# Patient Record
Sex: Female | Born: 1960 | Race: White | Hispanic: No | Smoking: Never smoker
Health system: Southern US, Community
[De-identification: ages and names within clinical notes are randomized; demographics above are authoritative.]

## PROBLEM LIST (undated history)

## (undated) DIAGNOSIS — N189 Chronic kidney disease, unspecified: Secondary | ICD-10-CM

## (undated) DIAGNOSIS — I10 Essential (primary) hypertension: Secondary | ICD-10-CM

## (undated) DIAGNOSIS — E118 Type 2 diabetes mellitus with unspecified complications: Secondary | ICD-10-CM

## (undated) HISTORY — PX: CARPAL TUNNEL RELEASE: SHX101

---

## 2017-09-24 ENCOUNTER — Other Ambulatory Visit: Payer: Self-pay

## 2017-09-24 ENCOUNTER — Inpatient Hospital Stay (HOSPITAL_COMMUNITY)
Admission: EM | Admit: 2017-09-24 | Discharge: 2017-10-04 | DRG: 682 | Disposition: A | Discharge status: Home / Self Care | Attending: Internal Medicine | Admitting: Internal Medicine

## 2017-09-24 ENCOUNTER — Encounter (HOSPITAL_COMMUNITY): Payer: Self-pay | Admitting: *Deleted

## 2017-09-24 ENCOUNTER — Inpatient Hospital Stay (HOSPITAL_COMMUNITY)

## 2017-09-24 DIAGNOSIS — Z66 Do not resuscitate: Secondary | ICD-10-CM | POA: Diagnosis present

## 2017-09-24 DIAGNOSIS — D638 Anemia in other chronic diseases classified elsewhere: Secondary | ICD-10-CM | POA: Diagnosis present

## 2017-09-24 DIAGNOSIS — Z79899 Other long term (current) drug therapy: Secondary | ICD-10-CM

## 2017-09-24 DIAGNOSIS — K767 Hepatorenal syndrome: Secondary | ICD-10-CM | POA: Diagnosis present

## 2017-09-24 DIAGNOSIS — E039 Hypothyroidism, unspecified: Secondary | ICD-10-CM | POA: Diagnosis present

## 2017-09-24 DIAGNOSIS — E1122 Type 2 diabetes mellitus with diabetic chronic kidney disease: Secondary | ICD-10-CM | POA: Diagnosis present

## 2017-09-24 DIAGNOSIS — R945 Abnormal results of liver function studies: Secondary | ICD-10-CM

## 2017-09-24 DIAGNOSIS — H5462 Unqualified visual loss, left eye, normal vision right eye: Secondary | ICD-10-CM | POA: Diagnosis present

## 2017-09-24 DIAGNOSIS — N183 Chronic kidney disease, stage 3 (moderate): Secondary | ICD-10-CM | POA: Diagnosis present

## 2017-09-24 DIAGNOSIS — H35022 Exudative retinopathy, left eye: Secondary | ICD-10-CM | POA: Diagnosis present

## 2017-09-24 DIAGNOSIS — R197 Diarrhea, unspecified: Secondary | ICD-10-CM | POA: Diagnosis present

## 2017-09-24 DIAGNOSIS — K7460 Unspecified cirrhosis of liver: Secondary | ICD-10-CM | POA: Diagnosis present

## 2017-09-24 DIAGNOSIS — Z515 Encounter for palliative care: Secondary | ICD-10-CM

## 2017-09-24 DIAGNOSIS — R000 Tachycardia, unspecified: Secondary | ICD-10-CM | POA: Diagnosis present

## 2017-09-24 DIAGNOSIS — N19 Unspecified kidney failure: Secondary | ICD-10-CM

## 2017-09-24 DIAGNOSIS — K529 Noninfective gastroenteritis and colitis, unspecified: Secondary | ICD-10-CM | POA: Diagnosis present

## 2017-09-24 DIAGNOSIS — R278 Other lack of coordination: Secondary | ICD-10-CM | POA: Diagnosis present

## 2017-09-24 DIAGNOSIS — I129 Hypertensive chronic kidney disease with stage 1 through stage 4 chronic kidney disease, or unspecified chronic kidney disease: Secondary | ICD-10-CM | POA: Diagnosis present

## 2017-09-24 DIAGNOSIS — K7290 Hepatic failure, unspecified without coma: Secondary | ICD-10-CM

## 2017-09-24 DIAGNOSIS — N179 Acute kidney failure, unspecified: Secondary | ICD-10-CM

## 2017-09-24 DIAGNOSIS — I959 Hypotension, unspecified: Secondary | ICD-10-CM | POA: Diagnosis present

## 2017-09-24 DIAGNOSIS — K7200 Acute and subacute hepatic failure without coma: Secondary | ICD-10-CM | POA: Diagnosis present

## 2017-09-24 DIAGNOSIS — E669 Obesity, unspecified: Secondary | ICD-10-CM | POA: Diagnosis present

## 2017-09-24 DIAGNOSIS — Z8249 Family history of ischemic heart disease and other diseases of the circulatory system: Secondary | ICD-10-CM

## 2017-09-24 DIAGNOSIS — D649 Anemia, unspecified: Secondary | ICD-10-CM | POA: Diagnosis present

## 2017-09-24 DIAGNOSIS — D6489 Other specified anemias: Secondary | ICD-10-CM | POA: Diagnosis present

## 2017-09-24 DIAGNOSIS — R600 Localized edema: Secondary | ICD-10-CM | POA: Diagnosis present

## 2017-09-24 DIAGNOSIS — K8020 Calculus of gallbladder without cholecystitis without obstruction: Secondary | ICD-10-CM | POA: Diagnosis present

## 2017-09-24 DIAGNOSIS — R1110 Vomiting, unspecified: Secondary | ICD-10-CM | POA: Diagnosis present

## 2017-09-24 DIAGNOSIS — E8809 Other disorders of plasma-protein metabolism, not elsewhere classified: Secondary | ICD-10-CM | POA: Diagnosis present

## 2017-09-24 DIAGNOSIS — Z7189 Other specified counseling: Secondary | ICD-10-CM

## 2017-09-24 DIAGNOSIS — Z79890 Hormone replacement therapy (postmenopausal): Secondary | ICD-10-CM

## 2017-09-24 DIAGNOSIS — R188 Other ascites: Secondary | ICD-10-CM

## 2017-09-24 DIAGNOSIS — Z6836 Body mass index (BMI) 36.0-36.9, adult: Secondary | ICD-10-CM

## 2017-09-24 HISTORY — DX: Essential (primary) hypertension: I10

## 2017-09-24 HISTORY — DX: Chronic kidney disease, unspecified: N18.9

## 2017-09-24 HISTORY — DX: Type 2 diabetes mellitus with unspecified complications: E11.8

## 2017-09-24 LAB — URINALYSIS, ROUTINE W REFLEX MICROSCOPIC
Bilirubin Urine: NEGATIVE
GLUCOSE, UA: NEGATIVE mg/dL
Hgb urine dipstick: NEGATIVE
Ketones, ur: NEGATIVE mg/dL
LEUKOCYTES UA: NEGATIVE
Nitrite: NEGATIVE
PROTEIN: NEGATIVE mg/dL
Specific Gravity, Urine: 1.017 (ref 1.005–1.030)
pH: 5 (ref 5.0–8.0)

## 2017-09-24 LAB — COMPREHENSIVE METABOLIC PANEL
ALBUMIN: 2.2 g/dL — AB (ref 3.5–5.0)
ALT: 50 U/L (ref 14–54)
AST: 87 U/L — AB (ref 15–41)
Alkaline Phosphatase: 106 U/L (ref 38–126)
Anion gap: 10 (ref 5–15)
BUN: 79 mg/dL — AB (ref 6–20)
CO2: 23 mmol/L (ref 22–32)
Calcium: 8.7 mg/dL — ABNORMAL LOW (ref 8.9–10.3)
Chloride: 107 mmol/L (ref 101–111)
Creatinine, Ser: 5.39 mg/dL — ABNORMAL HIGH (ref 0.44–1.00)
GFR calc Af Amer: 9 mL/min — ABNORMAL LOW (ref >60)
GFR, EST NON AFRICAN AMERICAN: 8 mL/min — AB (ref >60)
Glucose, Bld: 91 mg/dL (ref 65–99)
Potassium: 4.9 mmol/L (ref 3.5–5.1)
Sodium: 140 mmol/L (ref 135–145)
Total Bilirubin: 1.7 mg/dL — ABNORMAL HIGH (ref 0.3–1.2)
Total Protein: 6.6 g/dL (ref 6.5–8.1)

## 2017-09-24 LAB — CBC WITH DIFFERENTIAL/PLATELET
BASOS ABS: 0.1 10*3/uL (ref 0.0–0.1)
BASOS PCT: 1 %
EOS PCT: 3 %
Eosinophils Absolute: 0.2 10*3/uL (ref 0.0–0.7)
HCT: 35.4 % — ABNORMAL LOW (ref 36.0–46.0)
Hemoglobin: 11.6 g/dL — ABNORMAL LOW (ref 12.0–15.0)
Lymphocytes Relative: 22 %
Lymphs Abs: 1.5 10*3/uL (ref 0.7–4.0)
MCH: 28.6 pg (ref 26.0–34.0)
MCHC: 32.8 g/dL (ref 30.0–36.0)
MCV: 87.2 fL (ref 78.0–100.0)
MONO ABS: 0.4 10*3/uL (ref 0.1–1.0)
Monocytes Relative: 6 %
Neutro Abs: 4.8 10*3/uL (ref 1.7–7.7)
Neutrophils Relative %: 68 %
PLATELETS: 221 10*3/uL (ref 150–400)
RBC: 4.06 MIL/uL (ref 3.87–5.11)
RDW: 16.3 % — AB (ref 11.5–15.5)
WBC: 7 10*3/uL (ref 4.0–10.5)

## 2017-09-24 LAB — GLUCOSE, CAPILLARY: Glucose-Capillary: 70 mg/dL (ref 65–99)

## 2017-09-24 LAB — SODIUM, URINE, RANDOM

## 2017-09-24 LAB — MRSA PCR SCREENING: MRSA by PCR: NEGATIVE

## 2017-09-24 LAB — LIPASE, BLOOD: LIPASE: 32 U/L (ref 11–51)

## 2017-09-24 MED ORDER — INSULIN ASPART 100 UNIT/ML ~~LOC~~ SOLN: 0.0000 [IU] | Freq: Every day | SUBCUTANEOUS | Status: DC

## 2017-09-24 MED ORDER — FLUOXETINE HCL 20 MG PO CAPS
40.0000 mg | ORAL_CAPSULE | Freq: Every day | ORAL | Status: DC
  Administered 2017-09-25 – 2017-10-04 (×10): 40 mg via ORAL
  Filled 2017-09-24 (×10): qty 2

## 2017-09-24 MED ORDER — SODIUM CHLORIDE 0.9 % IV BOLUS (SEPSIS)
1000.0000 mL | Freq: Once | INTRAVENOUS | Status: AC
  Administered 2017-09-24: 1000 mL via INTRAVENOUS

## 2017-09-24 MED ORDER — LEVOTHYROXINE SODIUM 100 MCG PO TABS
100.0000 ug | ORAL_TABLET | Freq: Every day | ORAL | Status: DC
  Administered 2017-09-25: 100 ug via ORAL
  Filled 2017-09-24: qty 1

## 2017-09-24 MED ORDER — FUROSEMIDE 10 MG/ML IJ SOLN
20.0000 mg | Freq: Once | INTRAMUSCULAR | Status: DC
  Filled 2017-09-24: qty 4

## 2017-09-24 MED ORDER — NEPRO/CARBSTEADY PO LIQD
237.0000 mL | Freq: Two times a day (BID) | ORAL | Status: DC
  Filled 2017-09-24 (×2): qty 237

## 2017-09-24 MED ORDER — HEPARIN SODIUM (PORCINE) 5000 UNIT/ML IJ SOLN
5000.0000 [IU] | Freq: Three times a day (TID) | INTRAMUSCULAR | Status: DC
  Administered 2017-09-24 – 2017-10-04 (×30): 5000 [IU] via SUBCUTANEOUS
  Filled 2017-09-24 (×29): qty 1

## 2017-09-24 MED ORDER — ACETAMINOPHEN 325 MG PO TABS: 650.0000 mg | ORAL_TABLET | Freq: Four times a day (QID) | ORAL | Status: DC | PRN

## 2017-09-24 MED ORDER — SODIUM CHLORIDE 0.9 % IV SOLN
INTRAVENOUS | Status: AC
  Administered 2017-09-24: 23:00:00 via INTRAVENOUS

## 2017-09-24 MED ORDER — INSULIN ASPART 100 UNIT/ML ~~LOC~~ SOLN
0.0000 [IU] | Freq: Three times a day (TID) | SUBCUTANEOUS | Status: DC
  Administered 2017-09-27 – 2017-09-28 (×4): 1 [IU] via SUBCUTANEOUS
  Administered 2017-09-29 – 2017-09-30 (×2): 2 [IU] via SUBCUTANEOUS
  Administered 2017-09-30 – 2017-10-04 (×4): 1 [IU] via SUBCUTANEOUS

## 2017-09-24 MED ORDER — ACETAMINOPHEN 650 MG RE SUPP: 650.0000 mg | Freq: Four times a day (QID) | RECTAL | Status: DC | PRN

## 2017-09-24 MED ORDER — TETRACAINE HCL 0.5 % OP SOLN
2.0000 [drp] | Freq: Once | OPHTHALMIC | Status: AC
  Administered 2017-09-24: 2 [drp] via OPHTHALMIC
  Filled 2017-09-24: qty 4

## 2017-09-24 NOTE — ED Notes (Signed)
Dr.Knapp at bedside  

## 2017-09-24 NOTE — ED Notes (Signed)
ED TO INPATIENT HANDOFF REPORT  Name/Age/Gender [name] 57 y.o. female  Code Status Code Status History    This patient does not have a recorded code status. Please follow your organizational policy for patients in this situation.      Home/SNF/Other Jail  Chief Complaint Adema (Left Eye); Abdominal Pain  Level of Care/Admitting Diagnosis ED Disposition    ED Disposition Condition Comment   Admit  Hospital Area: [place] [[id number]]  Level of Care: Telemetry [5]  Admit to tele based on following criteria: Monitor for Ischemic changes  Diagnosis: ARF (acute renal failure) [facility]) [[id number]]  Admitting Physician: Jani Gravel [[id number]]  Attending Physician: Jani Gravel [phone]  Estimated length of stay: past midnight tomorrow  Certification:: I certify this patient will need inpatient services for at least 2 midnights  PT Class (Do Not Modify): Inpatient [101]  PT Acc Code (Do Not Modify): Private [1]       Medical History Past Medical History:  Diagnosis Date  . Hypertension     Allergies No Known Allergies  IV Location/Drains/Wounds Patient Lines/Drains/Airways Status   Active Line/Drains/Airways    None          Labs/Imaging Results for orders placed or performed during the hospital encounter of 09/24/17 (from the past 48 hour(s))  CBC with Differential     Status: Abnormal   Collection Time: 09/24/17  5:34 PM  Result Value Ref Range   WBC 7.0 4.0 - 10.5 K/uL   RBC 4.06 3.87 - 5.11 MIL/uL   Hemoglobin 11.6 (L) 12.0 - 15.0 g/dL   HCT 35.4 (L) 36.0 - 46.0 %   MCV 87.2 78.0 - 100.0 fL   MCH 28.6 26.0 - 34.0 pg   MCHC 32.8 30.0 - 36.0 g/dL   RDW 16.3 (H) 11.5 - 15.5 %   Platelets 221 150 - 400 K/uL   Neutrophils Relative % 68 %   Neutro Abs 4.8 1.7 - 7.7 K/uL   Lymphocytes Relative 22 %   Lymphs Abs 1.5 0.7 - 4.0 K/uL   Monocytes Relative 6 %   Monocytes Absolute 0.4 0.1 - 1.0 K/uL   Eosinophils Relative 3 %   Eosinophils  Absolute 0.2 0.0 - 0.7 K/uL   Basophils Relative 1 %   Basophils Absolute 0.1 0.0 - 0.1 K/uL    Comment: Performed at [facility], [place] [address] [place], [place] [address]  Comprehensive metabolic panel     Status: Abnormal   Collection Time: 09/24/17  5:34 PM  Result Value Ref Range   Sodium 140 135 - 145 mmol/L   Potassium 4.9 3.5 - 5.1 mmol/L   Chloride 107 101 - 111 mmol/L   CO2 23 22 - 32 mmol/L   Glucose, Bld 91 65 - 99 mg/dL   BUN 79 (H) 6 - 20 mg/dL   Creatinine, Ser 5.39 (H) 0.44 - 1.00 mg/dL   Calcium 8.7 (L) 8.9 - 10.3 mg/dL   Total Protein 6.6 6.5 - 8.1 g/dL   Albumin 2.2 (L) 3.5 - 5.0 g/dL   AST 87 (H) 15 - 41 U/L   ALT 50 14 - 54 U/L   Alkaline Phosphatase 106 38 - 126 U/L   Total Bilirubin 1.7 (H) 0.3 - 1.2 mg/dL   GFR calc non Af Amer 8 (L) >60 mL/min   GFR calc Af Amer 9 (L) >60 mL/min    Comment: (NOTE) The eGFR has been calculated using the CKD EPI equation. This calculation  has not been validated in all clinical situations. eGFR's persistently <60 mL/min signify possible Chronic Kidney Disease.    Anion gap 10 5 - 15    Comment: Performed at [facility], [place] [address] [place], [place] [address]  Lipase, blood     Status: None   Collection Time: 09/24/17  5:34 PM  Result Value Ref Range   Lipase 32 11 - 51 U/L    Comment: Performed at [facility], [place] [address], [place], [place] [address]  Urinalysis, Routine w reflex microscopic     Status: Abnormal   Collection Time: 09/24/17  7:01 PM  Result Value Ref Range   Color, Urine AMBER (A) YELLOW    Comment: BIOCHEMICALS MAY BE AFFECTED BY COLOR   APPearance CLEAR CLEAR   Specific Gravity, Urine 1.017 1.005 - 1.030   pH 5.0 5.0 - 8.0   Glucose, UA NEGATIVE NEGATIVE mg/dL   Hgb urine dipstick NEGATIVE NEGATIVE   Bilirubin Urine NEGATIVE NEGATIVE   Ketones, ur NEGATIVE NEGATIVE mg/dL   Protein, ur NEGATIVE NEGATIVE mg/dL   Nitrite NEGATIVE  NEGATIVE   Leukocytes, UA NEGATIVE NEGATIVE    Comment: Performed at [facility], [place] [address], [place], [place] [address]   No results found.  Pending Labs Unresulted Labs (From admission, onward)   Start     Ordered   09/24/17 1957  Protein electrophoresis, serum  Add-on,   R     09/24/17 1956   09/24/17 1956  Sodium, urine, random  Once,   R     09/24/17 1956   09/24/17 1956  Calcium / creatinine ratio, urine  Once,   R     09/24/17 1956      Vitals/Pain Today's Vitals   09/24/17 1555 09/24/17 1754 09/24/17 1919  BP: (!) 112/54 109/80 108/78  Pulse: (!) 103 93 96  Resp: 16 18 18   Temp: 97.8 F (36.6 C)    TempSrc: Oral    SpO2: 100% 100% 98%  Weight: 240 lb (108.9 kg)    Height: 5' 8"  (1.727 m)      Isolation Precautions No active isolations  Medications Medications  sodium chloride 0.9 % bolus 1,000 mL (not administered)  tetracaine (PONTOCAINE) 0.5 % ophthalmic solution 2 drop (2 drops Right Eye Given 09/24/17 1921)    Mobility walks

## 2017-09-24 NOTE — ED Provider Notes (Addendum)
[facility]-EMERGENCY DEPT Provider Note   CSN: [id number] Arrival date & time: 09/24/17  1521     History   Chief Complaint Chief Complaint  Patient presents with  . Facial Swelling    right eye  . Abdominal Pain    HPI [name] is a 57 y.o. female.  HPI Patient presents to the emergency room for evaluation of 2 separate complaints.  Patient states she has had swelling around her right eye that started on Friday.  She denies any trauma to the area.  She denies any recent drainage.  She has not noticed any redness.  No fevers or chills.  Patient states it is harder to see out of her right eye but that is because the eyelid is swollen.  Whenever she opens her eyelid she is able to see without difficulty.  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  Patient also is complaining of abdominal pain.  This is been intermittent for the last year.  She states about a week ago the symptoms started increasing.  She was having some trouble with vomiting and diarrhea.  The last time she vomited was several days ago.  She occasionally still has some loose stools.  The pain is primarily in the lower abdomen.  Patient is currently incarcerated.  She has been evaluated by medical at the jail.  She was sent to the ED today for further evaluation because of her persistent symptoms. Past Medical History:  Diagnosis Date  . Hypertension     There are no active problems to display for this patient.   History reviewed. No pertinent surgical history.  OB History    No data available       Home Medications    Prior to Admission medications   Medication Sig Start Date End Date Taking? Authorizing Provider  FLUoxetine (PROZAC) 20 MG capsule Take 40 mg by mouth daily.   Yes [provider]  levothyroxine (SYNTHROID, LEVOTHROID) 100 MCG tablet Take 100 mcg by mouth daily before breakfast.   Yes [provider]    Multiple Vitamins-Minerals (MULTIVITAMIN ADULT PO) Take 1 tablet by mouth daily.   Yes [provider]    Family History No family history on file.  Social History Social History   Tobacco Use  . Smoking status: Never Smoker  . Smokeless tobacco: Never Used  Substance Use Topics  . Alcohol use: No    Frequency: Never  . Drug use: Not on file     Allergies   Patient has no known allergies.   Review of Systems Review of Systems  All other systems reviewed and are negative.    Physical Exam Updated Vital Signs BP 108/78 (BP Location: Left Arm)   Pulse 96   Temp 97.8 F (36.6 C) (Oral)   Resp 18   Ht 1.727 m (5\' 8" )   Wt 108.9 kg (240 lb)   SpO2 98%   BMI 36.49 kg/m   Physical Exam  Constitutional: She appears well-developed and well-nourished. No distress.  HENT:  Head: Normocephalic and atraumatic.  Right Ear: External ear normal.  Left Ear: External ear normal.  Eyes: Conjunctivae and EOM are normal. Right eye exhibits no discharge and no exudate. Left eye exhibits no discharge and no exudate. Right conjunctiva is not injected. Right conjunctiva has no hemorrhage. Left conjunctiva is not injected. Left conjunctiva has no hemorrhage. No scleral icterus.  Pupils are equal and  reactive to light, there is edema of her upper eyelid, mild edema of the lower eyelid, mild erythema; ocular pressure 14 right eye  Neck: Neck supple. No tracheal deviation present.  Cardiovascular: Normal rate, regular rhythm and intact distal pulses.  Pulmonary/Chest: Effort normal and breath sounds normal. No stridor. No respiratory distress. She has no wheezes. She has no rales.  Abdominal: Soft. Bowel sounds are normal. She exhibits no distension. There is no tenderness. There is no rebound and no guarding.  Musculoskeletal: She exhibits no edema or tenderness.  Neurological: She is alert. She has normal strength. No cranial nerve deficit (no facial droop, extraocular movements  intact, no slurred speech) or sensory deficit. She exhibits normal muscle tone. She displays no seizure activity. Coordination normal.  Skin: Skin is warm and dry. No rash noted.  Mild rash on the scalp  Psychiatric: She has a normal mood and affect.  Nursing note and vitals reviewed.    ED Treatments / Results  Labs (all labs ordered are listed, but only abnormal results are displayed) Labs Reviewed  CBC WITH DIFFERENTIAL/PLATELET - Abnormal; Notable for the following components:      Result Value   Hemoglobin 11.6 (*)    HCT 35.4 (*)    RDW 16.3 (*)    All other components within normal limits  COMPREHENSIVE METABOLIC PANEL - Abnormal; Notable for the following components:   BUN 79 (*)    Creatinine, Ser 5.39 (*)    Calcium 8.7 (*)    Albumin 2.2 (*)    AST 87 (*)    Total Bilirubin 1.7 (*)    GFR calc non Af Amer 8 (*)    GFR calc Af Amer 9 (*)    All other components within normal limits  URINALYSIS, ROUTINE W REFLEX MICROSCOPIC - Abnormal; Notable for the following components:   Color, Urine AMBER (*)    All other components within normal limits  LIPASE, BLOOD     Radiology No results found.  Procedures Procedures (including critical care time)  Medications Ordered in ED Medications  furosemide (LASIX) injection 20 mg (not administered)  tetracaine (PONTOCAINE) 0.5 % ophthalmic solution 2 drop (2 drops Right Eye Given 09/24/17 1921)     Initial Impression / Assessment and Plan / ED Course  I have reviewed the triage vital signs and the nursing notes.  Pertinent labs & imaging results that were available during my care of the patient were reviewed by me and considered in my medical decision making (see chart for details).  Clinical Course as of Sep 25 1950  Mon Sep 24, 2017  [id number] Labs are consistent with renal failure.  NO old labs are available for comparison.  Nothing in our system or in care everywhere.  [JK]    Clinical Course User Index [JK] Linwood DibblesKnapp,  Kaien Pezzullo, MD    Patient presented to the emergency room for evaluation of abdominal pain as well as facial edema.  Patient's physical exam is notable for edema around her eyes but no definite signs of infection.  She has mildly elevated ocular pressures but I do not think that is clinically significant.  Doubt glaucoma or acute ocular emergency.  Patient's abdominal exam is benign.  I do not see any signs to suggest acute infection or obstruction.  Patient's laboratory tests do show renal failure.  No significant electrolyte abnormalities.  Patient does not have any old laboratory tests for comparison.  It is unclear if this is new or chronic.  She  does have evidence of edema around her eye but overall not clearly volume overloaded.  She has had some vomiting and diarrhea.  May be pre renal.  Considering the uncertainty of this being acute versus chronic, consult with the medical service for admission.  We will try to obtain records from other institutions for comparison.  Patient states she was admitted to [facility] previously.  I have requested records.  Final Clinical Impressions(s) / ED Diagnoses   Final diagnoses:  Facial edema  Renal failure, unspecified chronicity      Linwood Dibbles, MD 09/24/17 1952

## 2017-09-24 NOTE — H&P (Addendum)
TRH H&P   Patient Demographics:    Rusti Arizmendi, is a 57 y.o. female  MRN: [id number]   DOB - [birth year]  Admit Date - 09/24/2017  Outpatient Primary MD for the patient is Patient, No Pcp Per  Referring MD/NP/PA:     Iantha Fallen  Outpatient Specialists:  No nephrology per patient  [facility] from: jail  Chief Complaint  Patient presents with  . Facial Swelling    right eye  . Abdominal Pain      HPI:    Tamaira Ciriello  is a 57 y.o. female, w hypertension, ckd (unclear stage, no baseline creatinine), apparently presents w c/o chronic diarrhea for months as well as n/v a few days prior and some bilateral lower abdominal discomfort. Pt denies fever, chills, cp, palp, sob, constipation, brbpr, black stool.  Pt also noted slight facial swelling around her eye.  Pt presented to ED for the above complaints from jail.   In ED,  Wbc 7.0, Hgb 11.6, Plt 221 Na 140, K 4.9 Bun 79, Creatinne 5.39 Alb 2.2 Ast 87, Alt 80   Pt will be admitted for ARF, and abnormal liver function and also anemia.          Review of systems:    In addition to the HPI above,  No Fever-chills, No Headache, No changes with Vision or hearing, No problems swallowing food or Liquids, No Chest pain, Cough or Shortness of Breath,  No Blood in stool or Urine, No dysuria, No new skin rashes or bruises, No new joints pains-aches,  No new weakness, tingling, numbness in any extremity, No recent weight gain or loss, No polyuria, polydypsia or polyphagia, No significant Mental Stressors.  A full 10 point Review of Systems was done, except as stated above, all other Review of Systems were negative.   With Past History of the following :    Past Medical History:  Diagnosis Date  . CKD (chronic kidney disease)   . Hypertension       Past Surgical History:  Procedure Laterality Date    . CARPAL TUNNEL RELEASE Right       Social History:     Social History   Tobacco Use  . Smoking status: Never Smoker  . Smokeless tobacco: Never Used  Substance Use Topics  . Alcohol use: No    Frequency: Never     Lives - at home  Mobility - walks by self     Family History :     Family History  Problem Relation Age of Onset  . Heart attack Father       Home Medications:   Prior to Admission medications   Medication Sig Start Date End Date Taking? Authorizing Provider  FLUoxetine (PROZAC) 20 MG capsule Take 40 mg by mouth daily.   Yes [provider]  levothyroxine (SYNTHROID, LEVOTHROID) 100 MCG tablet Take 100  mcg by mouth daily before breakfast.   Yes [provider]  Multiple Vitamins-Minerals (MULTIVITAMIN ADULT PO) Take 1 tablet by mouth daily.   Yes [provider]     Allergies:    No Known Allergies   Physical Exam:   Vitals  Blood pressure 98/65, pulse 92, temperature 97.7 F (36.5 C), temperature source Oral, resp. rate 19, height 5\' 8"  (1.727 m), weight 108.9 kg (240 lb), SpO2 100 %.   1. General  lying in bed in NAD,   2. Normal affect and insight, Not Suicidal or Homicidal, Awake Alert, Oriented X 3.  3. No F.N deficits, ALL C.Nerves Intact, Strength 5/5 all 4 extremities, Sensation intact all 4 extremities, Plantars down going.  4. Ears and Eyes appear Normal, Conjunctivae clear, PERRLA. Moist Oral Mucosa.  5. Supple Neck, No JVD, No cervical lymphadenopathy appriciated, No Carotid Bruits.  6. Symmetrical Chest wall movement, Good air movement bilaterally, CTAB.  7. RRR, No Gallops, Rubs or Murmurs, No Parasternal Heave.  8. Positive Bowel Sounds, Abdomen Soft, No tenderness, No organomegaly appriciated,No rebound -guarding or rigidity.  9.  No Cyanosis, Normal Skin Turgor, No Skin Rash or Bruise.  10. Good muscle tone,  joints appear normal , no effusions, Normal ROM.  11. No Palpable Lymph Nodes in  Neck or Axillae     Data Review:    CBC Recent Labs  Lab 09/24/17 1734  WBC 7.0  HGB 11.6*  HCT 35.4*  PLT 221  MCV 87.2  MCH 28.6  MCHC 32.8  RDW 16.3*  LYMPHSABS 1.5  MONOABS 0.4  EOSABS 0.2  BASOSABS 0.1   ------------------------------------------------------------------------------------------------------------------  Chemistries  Recent Labs  Lab 09/24/17 1734  NA 140  K 4.9  CL 107  CO2 23  GLUCOSE 91  BUN 79*  CREATININE 5.39*  CALCIUM 8.7*  AST 87*  ALT 50  ALKPHOS 106  BILITOT 1.7*   ------------------------------------------------------------------------------------------------------------------ estimated creatinine clearance is 14.7 mL/min (A) (by C-G formula based on SCr of 5.39 mg/dL (H)). ------------------------------------------------------------------------------------------------------------------ No results for input(s): TSH, T4TOTAL, T3FREE, THYROIDAB in the last 72 hours.  Invalid input(s): FREET3  Coagulation profile No results for input(s): INR, PROTIME in the last 168 hours. ------------------------------------------------------------------------------------------------------------------- No results for input(s): DDIMER in the last 72 hours. -------------------------------------------------------------------------------------------------------------------  Cardiac Enzymes No results for input(s): CKMB, TROPONINI, MYOGLOBIN in the last 168 hours.  Invalid input(s): CK ------------------------------------------------------------------------------------------------------------------ No results found for: BNP   ---------------------------------------------------------------------------------------------------------------  Urinalysis    Component Value Date/Time   COLORURINE AMBER (A) 09/24/2017 1901   APPEARANCEUR CLEAR 09/24/2017 1901   LABSPEC 1.017 09/24/2017 1901   PHURINE 5.0 09/24/2017 1901   GLUCOSEU NEGATIVE  09/24/2017 1901   HGBUR NEGATIVE 09/24/2017 1901   BILIRUBINUR NEGATIVE 09/24/2017 1901   KETONESUR NEGATIVE 09/24/2017 1901   PROTEINUR NEGATIVE 09/24/2017 1901   NITRITE NEGATIVE 09/24/2017 1901   LEUKOCYTESUR NEGATIVE 09/24/2017 1901    ----------------------------------------------------------------------------------------------------------------   Imaging Results:    No results found.   Assessment & Plan:    Principal Problem:   ARF (acute renal failure) (HCC) Active Problems:   Diarrhea   Anemia   Tachycardia    ARF Check urine sodium, urine creatinine , urine eosinophils Check spep,  Check renal ultrasound Hydrate with ns iv Check cmp in am  Diarrhea Check Gi pathogen panel Check C. Diff Check Tsh  Tachycardia Tele Trop I q6h x3  Anemia Check cbc in am  Abnormal liver function Acute hepatitis panel Check RUQ ultrasound  Hypothyroidism Cont  Levothyroxine  Dm2 Fsbs ac and qhs, ISS   DVT Prophylaxis   Heparin- SCDs   AM Labs Ordered, also please review Full Orders  Family Communication: Admission, patients condition and plan of care including tests being ordered have been discussed with the patient  who indicate understanding and agree with the plan and Code Status.  Code Status FULL CODE  Likely DC to  home  Condition GUARDED    Consults called: none, please call renal and try to find out her baseline creatinine in AM  Admission status: inpatient  Time spent in minutes :45   Pearson GrippeJames Cavan Bearden M.D on 09/24/2017 at 9:55 PM  Between 7am to 7pm - Pager - [phone]  After 7pm go to [email or web address] - password [email or web address]  [facility] - Office  [phone]

## 2017-09-24 NOTE — ED Triage Notes (Signed)
Per EMS, pt from jail noticed increased right eye swelling x 3 days. Pt denies trauma to that area, reports hx of edema to that area. Pt also reports intermittent diarrhea and abdominal pain for the past year. Pt denies shortness of breath. Vital signs WDL, CBG 100.

## 2017-09-24 NOTE — ED Notes (Signed)
IV access attempted x 2 with ultrasound both unsuccessful.  

## 2017-09-24 NOTE — ED Notes (Signed)
DOCUMENTATION ERROR- PT IS NOT IN TRIAGE. PT ARRIVED BY EMS TO ROOM 16

## 2017-09-25 ENCOUNTER — Inpatient Hospital Stay (HOSPITAL_COMMUNITY)

## 2017-09-25 DIAGNOSIS — R945 Abnormal results of liver function studies: Secondary | ICD-10-CM

## 2017-09-25 DIAGNOSIS — D649 Anemia, unspecified: Secondary | ICD-10-CM

## 2017-09-25 LAB — COMPREHENSIVE METABOLIC PANEL
ALT: 44 U/L (ref 14–54)
AST: 79 U/L — AB (ref 15–41)
Albumin: 1.9 g/dL — ABNORMAL LOW (ref 3.5–5.0)
Alkaline Phosphatase: 98 U/L (ref 38–126)
Anion gap: 9 (ref 5–15)
BILIRUBIN TOTAL: 1.4 mg/dL — AB (ref 0.3–1.2)
BUN: 82 mg/dL — AB (ref 6–20)
CO2: 23 mmol/L (ref 22–32)
CREATININE: 5.35 mg/dL — AB (ref 0.44–1.00)
Calcium: 8.5 mg/dL — ABNORMAL LOW (ref 8.9–10.3)
Chloride: 109 mmol/L (ref 101–111)
GFR calc Af Amer: 9 mL/min — ABNORMAL LOW (ref >60)
GFR, EST NON AFRICAN AMERICAN: 8 mL/min — AB (ref >60)
GLUCOSE: 81 mg/dL (ref 65–99)
Potassium: 4.7 mmol/L (ref 3.5–5.1)
Sodium: 141 mmol/L (ref 135–145)
TOTAL PROTEIN: 6.1 g/dL — AB (ref 6.5–8.1)

## 2017-09-25 LAB — GLUCOSE, CAPILLARY
GLUCOSE-CAPILLARY: 100 mg/dL — AB (ref 65–99)
GLUCOSE-CAPILLARY: 75 mg/dL (ref 65–99)
GLUCOSE-CAPILLARY: 81 mg/dL (ref 65–99)
GLUCOSE-CAPILLARY: 87 mg/dL (ref 65–99)
GLUCOSE-CAPILLARY: 96 mg/dL (ref 65–99)

## 2017-09-25 LAB — PROTIME-INR
INR: 1.35
Prothrombin Time: 16.5 seconds — ABNORMAL HIGH (ref 11.4–15.2)

## 2017-09-25 LAB — LIPID PANEL
CHOLESTEROL: 141 mg/dL (ref 0–200)
HDL: 25 mg/dL — ABNORMAL LOW (ref >40)
LDL Cholesterol: 101 mg/dL — ABNORMAL HIGH (ref 0–99)
Total CHOL/HDL Ratio: 5.6 RATIO
Triglycerides: 77 mg/dL (ref <150)
VLDL: 15 mg/dL (ref 0–40)

## 2017-09-25 LAB — CALCIUM / CREATININE RATIO, URINE
CALCIUM UR: 1.2 mg/dL
Calcium/Creat.Ratio: 6 mg/g creat (ref 0–260)
Creatinine, Urine: 196.3 mg/dL

## 2017-09-25 LAB — CBC
HCT: 29.7 % — ABNORMAL LOW (ref 36.0–46.0)
Hemoglobin: 9.7 g/dL — ABNORMAL LOW (ref 12.0–15.0)
MCH: 28.5 pg (ref 26.0–34.0)
MCHC: 32.7 g/dL (ref 30.0–36.0)
MCV: 87.4 fL (ref 78.0–100.0)
PLATELETS: 209 10*3/uL (ref 150–400)
RBC: 3.4 MIL/uL — ABNORMAL LOW (ref 3.87–5.11)
RDW: 16.5 % — AB (ref 11.5–15.5)
WBC: 5.6 10*3/uL (ref 4.0–10.5)

## 2017-09-25 LAB — HIV ANTIBODY (ROUTINE TESTING W REFLEX): HIV SCREEN 4TH GENERATION: NONREACTIVE

## 2017-09-25 LAB — PROTEIN / CREATININE RATIO, URINE
Creatinine, Urine: 248.1 mg/dL
PROTEIN CREATININE RATIO: 0.15 mg/mg{creat} (ref 0.00–0.15)
Total Protein, Urine: 37 mg/dL

## 2017-09-25 LAB — AMMONIA: AMMONIA: 130 umol/L — AB (ref 9–35)

## 2017-09-25 LAB — SEDIMENTATION RATE: Sed Rate: 19 mm/hr (ref 0–22)

## 2017-09-25 LAB — C-REACTIVE PROTEIN: CRP: 1.5 mg/dL — ABNORMAL HIGH (ref <1.0)

## 2017-09-25 LAB — HEMOGLOBIN A1C
Hgb A1c MFr Bld: 4.3 % — ABNORMAL LOW (ref 4.8–5.6)
MEAN PLASMA GLUCOSE: 76.71 mg/dL

## 2017-09-25 LAB — TSH: TSH: 6.297 u[IU]/mL — AB (ref 0.350–4.500)

## 2017-09-25 LAB — CK: CK TOTAL: 18 U/L — AB (ref 38–234)

## 2017-09-25 MED ORDER — LACTULOSE 10 GM/15ML PO SOLN
20.0000 g | Freq: Three times a day (TID) | ORAL | Status: DC
  Administered 2017-09-25 – 2017-09-27 (×6): 20 g via ORAL
  Filled 2017-09-25 (×5): qty 30

## 2017-09-25 MED ORDER — GLUCERNA SHAKE PO LIQD
237.0000 mL | ORAL | Status: DC
  Administered 2017-09-26 – 2017-10-01 (×5): 237 mL via ORAL
  Filled 2017-09-25 (×8): qty 237

## 2017-09-25 MED ORDER — ONDANSETRON HCL 4 MG/2ML IJ SOLN
4.0000 mg | Freq: Four times a day (QID) | INTRAMUSCULAR | Status: DC | PRN
  Administered 2017-09-25 – 2017-10-03 (×3): 4 mg via INTRAVENOUS
  Filled 2017-09-25 (×3): qty 2

## 2017-09-25 MED ORDER — RIFAXIMIN 550 MG PO TABS
550.0000 mg | ORAL_TABLET | Freq: Two times a day (BID) | ORAL | Status: DC
  Administered 2017-09-25 – 2017-10-04 (×18): 550 mg via ORAL
  Filled 2017-09-25 (×18): qty 1

## 2017-09-25 MED ORDER — SODIUM CHLORIDE 0.9 % IV SOLN: INTRAVENOUS | Status: DC

## 2017-09-25 MED ORDER — GLUCERNA SHAKE PO LIQD: 237.0000 mL | Freq: Two times a day (BID) | ORAL | Status: DC

## 2017-09-25 MED ORDER — LACTATED RINGERS IV SOLN
INTRAVENOUS | Status: DC
  Administered 2017-09-25 – 2017-09-27 (×6): via INTRAVENOUS

## 2017-09-25 MED ORDER — LEVOTHYROXINE SODIUM 25 MCG PO TABS
125.0000 ug | ORAL_TABLET | Freq: Every day | ORAL | Status: DC
  Administered 2017-09-26 – 2017-10-04 (×9): 125 ug via ORAL
  Filled 2017-09-25 (×9): qty 1

## 2017-09-25 NOTE — Consult Note (Signed)
Wylene SimmerKathy XXXLawson Admit Date: 09/24/2017 09/25/2017 Arita MissSANFORD, Fredrica Capano B Requesting Physician:  York SpanielBuriev MD  Reason for Consult:  Renal Failure HPI:  57 year old female admitted to the hospital yesterday after presenting a chief complaint of diarrhea abdominal discomfort, nausea, vomiting.  She states the diarrhea has been present for 7 months.  He is unclear why yesterday was worse.  Workup in the emergency department demonstrated renal failure.  [facility].  PMH Incudes:  CKD, limited information available.  Discharge summary from August 2018 at [facility] with threatening of 1.9 after presenting with dehydration  Likely cirrhosis with ultrasonographic findings consistent with this and past medical history from outside records current than a mildly elevated AST.  I do not know if she is on lactulose  Hypertension  DM2, A1c was 4.3% upon presentation.  Patient is a very poor historian.  Records from details are not available, including MAR.  She has had no further diarrhea.  No urine throughout the day, she had a placement of a Foley catheter and all the urine output.  Renal workup to date has included an ultrasound showing a 9.3 cm right kidney and 6.4 cm left kidney with renal atrophy on the left side.  No hydronephrosis present.  Abdominal ultrasound also had gallstones and liver findings consistent with cirrhosis.  Labs demonstrate a creatinine of 5.35, BUN of 82, potassium of 4.7, bicarbonate 23.  Hemoglobin is 9.7, white count 5.6, platelet count 209.  HIV is negative.  Urine analysis without protein or blood.  Urine sodium is less than 10.  Patient has had some tremor.  It appears that this is asterixis.  She is not oriented to location, year, date.   Creatinine, Ser (mg/dL)  Date Value  98/11/914703/06/2018 5.35 (H)  09/24/2017 5.39 (H)  ] I/Os:  ROS NSAIDS: Unknown, out side MAR not available IV Contrast none TMP/SMX unknown Hypotension no overt  hypotension Balance of 12 systems is negative w/ exceptions as above  PMH  Past Medical History:  Diagnosis Date  . CKD (chronic kidney disease)   . Hypertension   . Type II diabetes mellitus with complication (HCC)    PSH  Past Surgical History:  Procedure Laterality Date  . CARPAL TUNNEL RELEASE Right    FH  Family History  Problem Relation Age of Onset  . Heart attack Father    SH  reports that  has never smoked. she has never used smokeless tobacco. She reports that she does not drink alcohol. Her drug history is not on file. Allergies No Known Allergies Home medications Prior to Admission medications   Medication Sig Start Date End Date Taking? Authorizing Provider  FLUoxetine (PROZAC) 20 MG capsule Take 40 mg by mouth daily.   Yes [provider]  levothyroxine (SYNTHROID, LEVOTHROID) 100 MCG tablet Take 100 mcg by mouth daily before breakfast.   Yes [provider]  Multiple Vitamins-Minerals (MULTIVITAMIN ADULT PO) Take 1 tablet by mouth daily.   Yes [provider]    Current Medications Scheduled Meds: . feeding supplement (GLUCERNA SHAKE)  237 mL Oral Q24H  . FLUoxetine  40 mg Oral Daily  . heparin  5,000 Units Subcutaneous Q8H  . insulin aspart  0-5 Units Subcutaneous QHS  . insulin aspart  0-9 Units Subcutaneous TID WC  . [START ON 09/26/2017] levothyroxine  125 mcg Oral QAC breakfast   Continuous Infusions: . lactated ringers     PRN Meds:.acetaminophen **OR** acetaminophen  CBC Recent  Labs  Lab 09/24/17 1734 09/25/17 0550  WBC 7.0 5.6  NEUTROABS 4.8  --   HGB 11.6* 9.7*  HCT 35.4* 29.7*  MCV 87.2 87.4  PLT 221 209   Basic Metabolic Panel Recent Labs  Lab 09/24/17 1734 09/25/17 0550  NA 140 141  K 4.9 4.7  CL 107 109  CO2 23 23  GLUCOSE 91 81  BUN 79* 82*  CREATININE 5.39* 5.35*  CALCIUM 8.7* 8.5*    Physical Exam  Blood pressure 124/74, pulse 78, temperature 97.8 F (36.6 C), temperature source Oral,  resp. rate 18, height 5\' 8"  (1.727 m), weight 112.2 kg (247 lb 5.7 oz), SpO2 100 %. GEN: No acute distress, somnolent, chronically ill appearing ENT: Poor dentition, NCAT EYES: EOMI CV: RRR, no rub, no murmur PULM: Diminished breath sounds in the bases, normal respiratory effort ABD: Obese, soft, nontender, no hepatosplenomegaly SKIN: No rashes or lesions EXT: Trace edema NEURO: Asterixis present, disoriented as above   Assessment 55F with renal failure, appears AoCKD3 (SCr was 2s in 02/2017), AMS, suspected cirrhosis.  Found to have L renal atrophy unknown chronicity but suspect not new  1. AoCKD3; limited outpt information available; suspect hypovolemia is contributing 2. AMS, suspect hepatic encephalopathy 3. L renal atrophy based on [place] at admission 4. Suspected cirrhosis 5. DM2 but A1c is normal 6. HTN, normal BPs  Plan 1. Increase hydration: LR @ 165mLh/hr x24h 2. Check Ammonia 3. F/u UP/C 4. Need MAR from jail 5. Daily weights, Daily Renal Panel, Strict I/Os, Avoid nephrotoxins (NSAIDs, judicious IV Contrast) 6. Will follow along    Sabra Heck MD [phone] pgr 09/25/2017, 4:03 PM

## 2017-09-25 NOTE — Progress Notes (Signed)
CSW received consult for assistance with disposition as pt admitted while in jail under police custody. Attempted to meet with pt however she was receiving bedside care.  Spoke with officer ([place] [facility]) at bedside- he explains pt is in custody of [employer], [facility], will return in their custody once stable for DC. Officer states someone will be at bedside during the course of pt's stay. Will follow in case needs arise, however plan as stated above, no current need for CSW intervention.  Ilean SkillMeghan Tomasina Keasling, MSW, LCSW Clinical Social Work 09/25/2017 [phone]

## 2017-09-25 NOTE — Progress Notes (Signed)
[facility] PROGRESS NOTE  [name] ZOX:[id number] DOB: 1961/03/08 DOA: 09/24/2017 PCP: Patient, No Pcp Per  Brief summary   57 y/o female, w hypertension, hypothyroidism, ckd (unclear stage, no baseline creatinine), apparently presents w c/o chronic diarrhea for months as well as n/v a few days prior and some bilateral lower abdominal discomfort. Pt denies fever, chills, cp, palp, sob, constipation, brbpr, black stool.  Pt also noted slight facial swelling around her eye.  Pt presented to ED for the above complaints from jail.   Assessment/Plan:  AKI. on ckd (unknown stage). possibly nephrotic syndrome. UA is not available yet. Hypoalbuminemia. Questionable due to underlying dm sclerosis ss fsgs. Hypoalbuminemia (uncelar if from cirrhosis or nephropathy). Will obtain UA, check proteinuria. Urine protein/creatine ratio. Check ana, ck, sed rate, crp. Pend upep. Hepatitis profile. Cont gentle iv fluids. Monitor I/o, urine output, renal labs. May need diuresis edema, if creatinine remains stable. Will consult nephrology evaluation  DM. Unclear history. Reports taking metformin in the past. Will check ha1c. Monitor on ISS  Possible liver cirrhosis. She denies significant etoh use. Will check hepatitis profile. Check inr -Cholelithiasis. No s/s of cholecystitis.   Hypothyroidism, tsh-6.2. Will increase levothyroxine to 125.   Reported diarrhea prior to admission. No diarrhea in the hospital yet. Will monitor   Code Status: full Family Communication: d/w patient, RN (indicate person spoken with, relationship, and if by phone, the number) Disposition Plan: pend further work up. Patient is from jail    Consultants:  none  Procedures:  Pend abd [place]  Antibiotics:  none (indicate start date, and stop date if known)  HPI/Subjective: Alert. Reports chronic diarrhea. But no bowel movement overnight. No acute dyspnea./   Objective: Vitals:   09/24/17 2128 09/25/17 0436  BP:  98/65 124/74  Pulse: 92 78  Resp: 19 18  Temp: 97.7 F (36.5 C) 97.8 F (36.6 C)  SpO2: 100% 100%    Intake/Output Summary (Last 24 hours) at 09/25/2017 1214 Last data filed at 09/25/2017 0600 Gross per 24 hour  Intake 557.5 ml  Output -  Net 557.5 ml   Filed Weights   09/24/17 1555 09/25/17 0436  Weight: 108.9 kg (240 lb) 112.2 kg (247 lb 5.7 oz)    Exam:   General:  No distress   Cardiovascular: s1,s2 rrr  Respiratory: no wheezing   Abdomen: soft, obese, nt  Musculoskeletal: leg edema    Data Reviewed: Basic Metabolic Panel: Recent Labs  Lab 09/24/17 1734 09/25/17 0550  NA 140 141  K 4.9 4.7  CL 107 109  CO2 23 23  GLUCOSE 91 81  BUN 79* 82*  CREATININE 5.39* 5.35*  CALCIUM 8.7* 8.5*   Liver Function Tests: Recent Labs  Lab 09/24/17 1734 09/25/17 0550  AST 87* 79*  ALT 50 44  ALKPHOS 106 98  BILITOT 1.7* 1.4*  PROT 6.6 6.1*  ALBUMIN 2.2* 1.9*   Recent Labs  Lab 09/24/17 1734  LIPASE 32   No results for input(s): AMMONIA in the last 168 hours. CBC: Recent Labs  Lab 09/24/17 1734 09/25/17 0550  WBC 7.0 5.6  NEUTROABS 4.8  --   HGB 11.6* 9.7*  HCT 35.4* 29.7*  MCV 87.2 87.4  PLT 221 209   Cardiac Enzymes: No results for input(s): CKTOTAL, CKMB, CKMBINDEX, TROPONINI in the last 168 hours. BNP (last 3 results) No results for input(s): BNP in the last 8760 hours.  ProBNP (last 3 results) No results for input(s): PROBNP in the last 8760 hours.  CBG: Recent Labs  Lab 09/24/17 2231 09/25/17 0003 09/25/17 0746  GLUCAP 70 81 75    Recent Results (from the past 240 hour(s))  MRSA PCR Screening     Status: None   Collection Time: 09/24/17 10:13 PM  Result Value Ref Range Status   MRSA by PCR NEGATIVE NEGATIVE Final    Comment:        The GeneXpert MRSA Assay (FDA approved for NASAL specimens only), is one component of a comprehensive MRSA colonization surveillance program. It is not intended to diagnose MRSA infection nor  to guide or monitor treatment for MRSA infections. Performed at [facility], [address] [place], [place] [id number]      Studies: Kore[place] Abdomen Complete  Result Date: 09/25/2017 CLINICAL DATA:  Abnormal liver function tests, hypertension, diabetes EXAM: ABDOMEN ULTRASOUND COMPLETE COMPARISON:  None. FINDINGS: Gallbladder: There are gallstones present, the largest of 2.4 cm in diameter with acoustical shadowing. There is no pain over the gallbladder with compression. The gallbladder wall is somewhat prominent measuring 4.5 mm. No pericholecystic fluid is seen. Common bile duct: Diameter: The common bile duct is normal measuring 5.2 mm in diameter. Liver: The liver is small and echogenic consistent with fatty infiltration. The contours are somewhat irregular which may indicate changes of cirrhosis. Correlate clinically. Portal vein is patent on color Doppler imaging with normal direction of blood flow towards the liver. IVC: No abnormality visualized. Pancreas: The pancreas is obscured by bowel gas. Spleen: The spleen measures 8.9 cm. Right Kidney: Length: 9.3 cm.  No hydronephrosis is seen. Left Kidney: Length: 6.4 cm. The left kidney appears atrophic. No hydronephrosis is noted. Abdominal aorta: The abdominal aorta is normal in caliber. Other findings: There is a moderate amount of ascites throughout the peritoneal cavity. IMPRESSION: 1. Gallstones, the largest of 2.4 cm. No pain is present over the gallbladder currently with compression. 2. Echogenic liver parenchyma with the liver being small with somewhat nodular contour suggesting changes of cirrhosis. Correlate clinically. 3. The pancreas is obscured by bowel gas. 4. Atrophic appearing left kidney.  No hydronephrosis. 5. Moderate amount of ascites throughout the peritoneal cavity. Electronically Signed   By: Dwyane DeePaul  Barry M.D.   On: 09/25/2017 10:35    Scheduled Meds: . feeding supplement (NEPRO CARB STEADY)  237 mL Oral BID BM   . FLUoxetine  40 mg Oral Daily  . heparin  5,000 Units Subcutaneous Q8H  . insulin aspart  0-5 Units Subcutaneous QHS  . insulin aspart  0-9 Units Subcutaneous TID WC  . levothyroxine  100 mcg Oral QAC breakfast   Continuous Infusions:  Principal Problem:   ARF (acute renal failure) (HCC) Active Problems:   Diarrhea   Anemia   Tachycardia   Abnormal liver function    Time spent: >35 minutes     Esperanza SheetsBURIEV, Regie Bunner N  [facility] Pager [phone]. If 7PM-7AM, please contact night-coverage at [email or web address], password [email or web address] 09/25/2017, 12:14 PM  LOS: 1 day

## 2017-09-25 NOTE — Progress Notes (Addendum)
Initial Nutrition Assessment  DOCUMENTATION CODES:   Obesity unspecified  INTERVENTION:   Glucerna Shake once daily, each supplement provides 220 kcal and 10 grams of protein  NUTRITION DIAGNOSIS:   Inadequate oral intake related to vomiting, nausea as evidenced by per patient/family report.  GOAL:   Patient will meet greater than or equal to 90% of their needs  MONITOR:   PO intake, Supplement acceptance, Weight trends, Labs  REASON FOR ASSESSMENT:   Malnutrition Screening Tool    ASSESSMENT:   Patient with PMH significant for HTN and CKD (unclear of stage). Presents this admission with chronic diarrhea and acute renal failure.    Spoke with pt at bedside. Reports having decrease in appetite for the last 7 months due to ongoing vomiting upon eating. States she was placed on a consistent carbohydrate diet in jail and ate beans three times per day. Pt endorses drinking four Ensure's/day. Pt had pancakes and scrambled for breakfast with out any complication. Denies nausea/vomiting with breakfast. Denies any swallowing issues. Pt currently receiving Nepro BID. Will change to Glucerna once daily for taste preferences and monitor electrolytes closely.   Pt endorses a UBW of 300 lb, the last time being at that weight 7 months ago. Records are limited in weight history. Unable to verify wt loss at this time. Nutrition-Focused physical exam completed. Pt complains of difficulties with walking at this time.   Medications reviewed and include: SSI Labs reviewed: BUN 82 (H) Creatinine 5.35 (H)   NUTRITION - FOCUSED PHYSICAL EXAM:    Most Recent Value  Orbital Region  No depletion  Upper Arm Region  No depletion  Thoracic and Lumbar Region  No depletion  Buccal Region  No depletion  Temple Region  No depletion  Clavicle Bone Region  No depletion  Clavicle and Acromion Bone Region  No depletion  Scapular Bone Region  No depletion  Dorsal Hand  No depletion  Patellar Region  No  depletion  Anterior Thigh Region  No depletion  Posterior Calf Region  No depletion  Edema (RD Assessment)  Mild     Diet Order:  Diet renal/carb modified with fluid restriction Diet-HS Snack? Nothing; Fluid restriction: 1200 mL Fluid; Room service appropriate? Yes; Fluid consistency: Thin  EDUCATION NEEDS:   Not appropriate for education at this time  Skin:  Skin Assessment: Reviewed RN Assessment  Last BM:  09/24/17  Height:   Ht Readings from Last 1 Encounters:  09/24/17 5\' 8"  (1.727 m)    Weight:   Wt Readings from Last 1 Encounters:  09/25/17 247 lb 5.7 oz (112.2 kg)    Ideal Body Weight:  63.6 kg  BMI:  Body mass index is 37.61 kg/m.  Estimated Nutritional Needs:   Kcal:  1600-1800 kcal/day  Protein:  80-90 g/day  Fluid:  Fluid Restriction 1200 ml/day    [name] RD, LDN Clinical Nutrition Pager # - [phone]

## 2017-09-26 LAB — CBC
HCT: 28.7 % — ABNORMAL LOW (ref 36.0–46.0)
HEMOGLOBIN: 9.4 g/dL — AB (ref 12.0–15.0)
MCH: 28.6 pg (ref 26.0–34.0)
MCHC: 32.8 g/dL (ref 30.0–36.0)
MCV: 87.2 fL (ref 78.0–100.0)
PLATELETS: 158 10*3/uL (ref 150–400)
RBC: 3.29 MIL/uL — AB (ref 3.87–5.11)
RDW: 16.7 % — ABNORMAL HIGH (ref 11.5–15.5)
WBC: 5.7 10*3/uL (ref 4.0–10.5)

## 2017-09-26 LAB — PROTEIN ELECTROPHORESIS, SERUM
A/G RATIO SPE: 0.6 — AB (ref 0.7–1.7)
ALPHA-2-GLOBULIN: 0.6 g/dL (ref 0.4–1.0)
Albumin ELP: 2.2 g/dL — ABNORMAL LOW (ref 2.9–4.4)
Alpha-1-Globulin: 0.2 g/dL (ref 0.0–0.4)
Beta Globulin: 0.8 g/dL (ref 0.7–1.3)
GLOBULIN, TOTAL: 3.9 g/dL (ref 2.2–3.9)
Gamma Globulin: 2.3 g/dL — ABNORMAL HIGH (ref 0.4–1.8)
Total Protein ELP: 6.1 g/dL (ref 6.0–8.5)

## 2017-09-26 LAB — HEPATITIS PANEL, ACUTE
HEP A IGM: NEGATIVE
HEP B C IGM: POSITIVE — AB
HEP B S AG: NEGATIVE

## 2017-09-26 LAB — COMPREHENSIVE METABOLIC PANEL
ALT: 37 U/L (ref 14–54)
AST: 66 U/L — AB (ref 15–41)
Albumin: 1.7 g/dL — ABNORMAL LOW (ref 3.5–5.0)
Alkaline Phosphatase: 91 U/L (ref 38–126)
Anion gap: 8 (ref 5–15)
BUN: 82 mg/dL — AB (ref 6–20)
CO2: 21 mmol/L — AB (ref 22–32)
CREATININE: 5.27 mg/dL — AB (ref 0.44–1.00)
Calcium: 8.1 mg/dL — ABNORMAL LOW (ref 8.9–10.3)
Chloride: 110 mmol/L (ref 101–111)
GFR calc Af Amer: 9 mL/min — ABNORMAL LOW (ref >60)
GFR calc non Af Amer: 8 mL/min — ABNORMAL LOW (ref >60)
Glucose, Bld: 94 mg/dL (ref 65–99)
Potassium: 4.6 mmol/L (ref 3.5–5.1)
SODIUM: 139 mmol/L (ref 135–145)
Total Bilirubin: 1.6 mg/dL — ABNORMAL HIGH (ref 0.3–1.2)
Total Protein: 5.1 g/dL — ABNORMAL LOW (ref 6.5–8.1)

## 2017-09-26 LAB — C DIFFICILE QUICK SCREEN W PCR REFLEX
C Diff antigen: POSITIVE — AB
C Diff toxin: NEGATIVE

## 2017-09-26 LAB — CLOSTRIDIUM DIFFICILE BY PCR, REFLEXED: Toxigenic C. Difficile by PCR: NEGATIVE

## 2017-09-26 LAB — AMMONIA: Ammonia: 143 umol/L — ABNORMAL HIGH (ref 9–35)

## 2017-09-26 LAB — ANA W/REFLEX IF POSITIVE: Anti Nuclear Antibody(ANA): NEGATIVE

## 2017-09-26 LAB — GLUCOSE, CAPILLARY
GLUCOSE-CAPILLARY: 96 mg/dL (ref 65–99)
Glucose-Capillary: 92 mg/dL (ref 65–99)
Glucose-Capillary: 77 mg/dL (ref 65–99)
Glucose-Capillary: 107 mg/dL — ABNORMAL HIGH (ref 65–99)

## 2017-09-26 NOTE — Progress Notes (Signed)
Patient Demographics:    [name], is a 57 y.o. female, DOB - December 21, 1960, ZOX:[id number]  Admit date - 09/24/2017   Admitting Physician Pearson GrippeJames Kim, MD  Outpatient Primary MD for the patient is Patient, No Pcp Per  LOS - 2   Chief Complaint  Patient presents with  . Facial Swelling    right eye  . Abdominal Pain        Subjective:    [name] today has no fevers, no emesis,  No chest pain,  Prison guard at bedside   Assessment  & Plan :    Principal Problem:   ARF (acute renal failure) (HCC) Active Problems:   Diarrhea   Anemia   Tachycardia   Abnormal liver function  Brief Summary:- 57 y/o female,w hypertension, hypothyroidism, ckd (unclear stage, no baseline creatinine), apparently presents w c/ochronic diarrhea for months as well as n/v a few days prior and some bilateral lower abdominal discomfort. Pt denies fever, chills, cp, palp, sob, constipation, brbpr, black stool. Pt also noted slight facial swelling around her eye. Pt presented to ED for the above complaints from jail.   Assessment/Plan:  1)AKI. on ckd (unknown stage). possibly nephrotic syndrome. UA is not available yet. Hypoalbuminemia. Questionable due to underlying dm sclerosis ss fsgs. Hypoalbuminemia (uncelar if from cirrhosis or nephropathy). ana, ck, sed rate, crp. Pend upep. Hepatitis profile. Cont gentle iv fluids. Monitor I/o, urine output, renal labs. May need diuresis edema, if creatinine remains stable.  Nephrology consult appreciated  2)DM- Reports taking metformin in the past.  Last A1c 4.3 continue sliding scale  3)Possible liver cirrhosis. She denies significant etoh use. ,  Abdominal ultrasound with asymptomatic Cholelithiasis. No s/s of cholecystitis.   4)Hypothyroidism, tsh-6.2.   levothyroxine  Was increased to 125mcg.   5)Anemia-suspect some degree of chronic anemia now with H&H trending  down most likely from hemodilution with hydration  Code Status: full Family Communication: d/w patient, RN  Disposition Plan: pend further work up. Patient is from jail    DVT Prophylaxis  : Heparin  Lab Results  Component Value Date   PLT 158 09/26/2017    Inpatient Medications  Scheduled Meds: . feeding supplement (GLUCERNA SHAKE)  237 mL Oral Q24H  . FLUoxetine  40 mg Oral Daily  . heparin  5,000 Units Subcutaneous Q8H  . insulin aspart  0-5 Units Subcutaneous QHS  . insulin aspart  0-9 Units Subcutaneous TID WC  . lactulose  20 g Oral TID  . levothyroxine  125 mcg Oral QAC breakfast  . rifaximin  550 mg Oral BID   Continuous Infusions: . lactated ringers 125 mL/hr at 09/26/17 1327   PRN Meds:.acetaminophen **OR** acetaminophen, ondansetron (ZOFRAN) IV    Anti-infectives (From admission, onward)   Start     Dose/Rate Route Frequency Ordered Stop   09/25/17 2200  rifaximin (XIFAXAN) tablet 550 mg     550 mg Oral 2 times daily 09/25/17 1739          Objective:   Vitals:   09/25/17 0436 09/25/17 2111 09/26/17 0533 09/26/17 1454  BP: 124/74 124/77 119/74 105/63  Pulse: 78 83 71 71  Resp: 18 18 16 18   Temp: 97.8 F (36.6 C) 97.8 F (36.6 C) 97.9 F (  36.6 C) (!) 97.1 F (36.2 C)  TempSrc: Oral Oral Axillary Axillary  SpO2: 100% 97% 100% 100%  Weight: 112.2 kg (247 lb 5.7 oz)  110.8 kg (244 lb 4.3 oz)   Height:        Wt Readings from Last 3 Encounters:  09/26/17 110.8 kg (244 lb 4.3 oz)     Intake/Output Summary (Last 24 hours) at 09/26/2017 1652 Last data filed at 09/26/2017 1603 Gross per 24 hour  Intake 3608.34 ml  Output 275 ml  Net 3333.34 ml     Physical Exam  Gen:- Awake Alert,  In no apparent distress , obese HEENT:- [place].AT, No sclera icterus Neck-Supple Neck,No JVD,.  Lungs-  CTAB  CV- S1, S2 normal Abd-  +ve B.Sounds, Abd Soft, No tenderness, increased truncal adiposity Extremity/Skin:- No  edema,    Psych-affect is  appropriate Neuro-no new focal deficits, no tremors   Data Review:   Micro Results Recent Results (from the past 240 hour(s))  MRSA PCR Screening     Status: None   Collection Time: 09/24/17 10:13 PM  Result Value Ref Range Status   MRSA by PCR NEGATIVE NEGATIVE Final    Comment:        The GeneXpert MRSA Assay (FDA approved for NASAL specimens only), is one component of a comprehensive MRSA colonization surveillance program. It is not intended to diagnose MRSA infection nor to guide or monitor treatment for MRSA infections. Performed at [facility], [address], [place], [place] [address]   C difficile quick scan w PCR reflex     Status: Abnormal   Collection Time: 09/26/17  2:42 AM  Result Value Ref Range Status   C Diff antigen POSITIVE (A) NEGATIVE Final   C Diff toxin NEGATIVE NEGATIVE Final   C Diff interpretation Results are indeterminate. See PCR results.  Final    Comment: Performed at [facility], [address] [place], [place] [address]  C. Diff by PCR, Reflexed     Status: None   Collection Time: 09/26/17  2:42 AM  Result Value Ref Range Status   Toxigenic C. Difficile by PCR NEGATIVE NEGATIVE Final    Comment: Patient is colonized with non toxigenic C. difficile. May not need treatment unless significant symptoms are present. Performed at [facility], [address], [place], [place] [address]     Radiology Reports US Abdomen Complete  Result Date: 09/25/2017 CLINICAL DATA:  Abnormal liver function tests, hypertension, diabetes EXAM: ABDOMEN ULTRASOUND COMPLETE COMPARISON:  None. FINDINGS: Gallbladder: There are gallstones present, the largest of 2.4 cm in diameter with acoustical shadowing. There is no pain over the gallbladder with compression. The gallbladder wall is somewhat prominent measuring 4.5 mm. No pericholecystic fluid is seen. Common bile duct: Diameter: The common bile duct is normal measuring 5.2  mm in diameter. Liver: The liver is small and echogenic consistent with fatty infiltration. The contours are somewhat irregular which may indicate changes of cirrhosis. Correlate clinically. Portal vein is patent on color Doppler imaging with normal direction of blood flow towards the liver. IVC: No abnormality visualized. Pancreas: The pancreas is obscured by bowel gas. Spleen: The spleen measures 8.9 cm. Right Kidney: Length: 9.3 cm.  No hydronephrosis is seen. Left Kidney: Length: 6.4 cm. The left kidney appears atrophic. No hydronephrosis is noted. Abdominal aorta: The abdominal aorta is normal in caliber. Other findings: There is a moderate amount of ascites throughout the peritoneal cavity. IMPRESSION: 1. Gallstones, the  largest of 2.4 cm. No pain is present over the gallbladder currently with compression. 2. Echogenic liver parenchyma with the liver being small with somewhat nodular contour suggesting changes of cirrhosis. Correlate clinically. 3. The pancreas is obscured by bowel gas. 4. Atrophic appearing left kidney.  No hydronephrosis. 5. Moderate amount of ascites throughout the peritoneal cavity. Electronically Signed   By: Dwyane Dee M.D.   On: 09/25/2017 10:35     CBC Recent Labs  Lab 09/24/17 1734 09/25/17 0550 09/26/17 0444  WBC 7.0 5.6 5.7  HGB 11.6* 9.7* 9.4*  HCT 35.4* 29.7* 28.7*  PLT 221 209 158  MCV 87.2 87.4 87.2  MCH 28.6 28.5 28.6  MCHC 32.8 32.7 32.8  RDW 16.3* 16.5* 16.7*  LYMPHSABS 1.5  --   --   MONOABS 0.4  --   --   EOSABS 0.2  --   --   BASOSABS 0.1  --   --     Chemistries  Recent Labs  Lab 09/24/17 1734 09/25/17 0550 09/26/17 0444  NA 140 141 139  K 4.9 4.7 4.6  CL 107 109 110  CO2 23 23 21*  GLUCOSE 91 81 94  BUN 79* 82* 82*  CREATININE 5.39* 5.35* 5.27*  CALCIUM 8.7* 8.5* 8.1*  AST 87* 79* 66*  ALT 50 44 37  ALKPHOS 106 98 91  BILITOT 1.7* 1.4* 1.6*    ------------------------------------------------------------------------------------------------------------------ Recent Labs    09/25/17 0550  CHOL 141  HDL 25*  LDLCALC 101*  TRIG 77  CHOLHDL 5.6    Lab Results  Component Value Date   HGBA1C 4.3 (L) 09/25/2017   ------------------------------------------------------------------------------------------------------------------ Recent Labs    09/25/17 0558  TSH 6.297*   ------------------------------------------------------------------------------------------------------------------ No results for input(s): VITAMINB12, FOLATE, FERRITIN, TIBC, IRON, RETICCTPCT in the last 72 hours.  Coagulation profile Recent Labs  Lab 09/25/17 1239  INR 1.35    No results for input(s): DDIMER in the last 72 hours.  Cardiac Enzymes No results for input(s): CKMB, TROPONINI, MYOGLOBIN in the last 168 hours.  Invalid input(s): CK ------------------------------------------------------------------------------------------------------------------ No results found for: BNP   Shon Hale M.D on 09/26/2017 at 4:52 PM  Between 7am to 7pm - Pager - [phone]  After 7pm go to [email or web address] - password [email or web address]  [facility] -  Office  [phone]   Voice Recognition Reubin Milan dictation system was used to create this note, attempts have been made to correct errors. Please contact the author with questions and/or clarifications.

## 2017-09-26 NOTE — Progress Notes (Signed)
Admit: 09/24/2017 LOS: 2  70F with renal failure, appears AoCKD3 (SCr was 2s in 02/2017), AMS, suspected cirrhosis.  Found to have L renal atrophy unknown chronicity but suspect not new  Subjective:  No new issues NH3 was up, now on rifaximin + lactulose More cogent today,s till with asterixus  MAR as outpt w/o nephrotoxins, no NSAIDS, no RAAS inhib, no ABX  03/12 0701 - 03/13 0700 In: 2701.3 [P.O.:720; I.V.:1981.3] Out: 675 [Urine:675]  Filed Weights   09/24/17 1555 09/25/17 0436 09/26/17 0533  Weight: 108.9 kg (240 lb) 112.2 kg (247 lb 5.7 oz) 110.8 kg (244 lb 4.3 oz)    Scheduled Meds: . feeding supplement (GLUCERNA SHAKE)  237 mL Oral Q24H  . FLUoxetine  40 mg Oral Daily  . heparin  5,000 Units Subcutaneous Q8H  . insulin aspart  0-5 Units Subcutaneous QHS  . insulin aspart  0-9 Units Subcutaneous TID WC  . lactulose  20 g Oral TID  . levothyroxine  125 mcg Oral QAC breakfast  . rifaximin  550 mg Oral BID   Continuous Infusions: . lactated ringers 125 mL/hr at 09/26/17 0716   PRN Meds:.acetaminophen **OR** acetaminophen, ondansetron (ZOFRAN) IV  Current Labs: reviewed    Physical Exam:  Blood pressure 119/74, pulse 71, temperature 97.9 F (36.6 C), temperature source Axillary, resp. rate 16, height 5\' 8"  (1.727 m), weight 110.8 kg (244 lb 4.3 oz), SpO2 100 %. GEN: No acute distress, awake, chronically ill appearing ENT: Poor dentition, NCAT EYES: EOMI CV: RRR, no rub, no murmur PULM: Diminished breath sounds in the bases, normal respiratory effort ABD: Obese, soft, nontender, no hepatosplenomegaly SKIN: No rashes or lesions EXT: Trace edema NEURO: Asterixis present  A 1. AoCKD3 vs CKD progression; limited outpt information available; suspect hypovolemia is contributing 2. AMS, hepatic encephalopathy on lactulose + rifaximin 3. L renal atrophy based on US at admission 4. Cirrhosis 5. DM2 but A1c is normal 6. HTN, normal BPs  P 1. Cont IVFs for another  24h 2. Daily weights, Daily Renal Panel, Strict I/Os, Avoid nephrotoxins (NSAIDs, judicious IV Contrast) 3. Will continue to follow   Sabra Heckyan Sanford MD 09/26/2017, 12:35 PM  Recent Labs  Lab 09/24/17 1734 09/25/17 0550 09/26/17 0444  NA 140 141 139  K 4.9 4.7 4.6  CL 107 109 110  CO2 23 23 21*  GLUCOSE 91 81 94  BUN 79* 82* 82*  CREATININE 5.39* 5.35* 5.27*  CALCIUM 8.7* 8.5* 8.1*   Recent Labs  Lab 09/24/17 1734 09/25/17 0550 09/26/17 0444  WBC 7.0 5.6 5.7  NEUTROABS 4.8  --   --   HGB 11.6* 9.7* 9.4*  HCT 35.4* 29.7* 28.7*  MCV 87.2 87.4 87.2  PLT 221 209 158

## 2017-09-27 LAB — RENAL FUNCTION PANEL
ALBUMIN: 1.8 g/dL — AB (ref 3.5–5.0)
ANION GAP: 10 (ref 5–15)
BUN: 75 mg/dL — AB (ref 6–20)
CO2: 20 mmol/L — AB (ref 22–32)
CREATININE: 4.82 mg/dL — AB (ref 0.44–1.00)
Calcium: 8.3 mg/dL — ABNORMAL LOW (ref 8.9–10.3)
Chloride: 111 mmol/L (ref 101–111)
GFR calc Af Amer: 11 mL/min — ABNORMAL LOW (ref >60)
GFR calc non Af Amer: 9 mL/min — ABNORMAL LOW (ref >60)
GLUCOSE: 100 mg/dL — AB (ref 65–99)
PHOSPHORUS: 4 mg/dL (ref 2.5–4.6)
Potassium: 4.1 mmol/L (ref 3.5–5.1)
SODIUM: 141 mmol/L (ref 135–145)

## 2017-09-27 LAB — CBC
HCT: 31.9 % — ABNORMAL LOW (ref 36.0–46.0)
Hemoglobin: 10.3 g/dL — ABNORMAL LOW (ref 12.0–15.0)
MCH: 28.2 pg (ref 26.0–34.0)
MCHC: 32.3 g/dL (ref 30.0–36.0)
MCV: 87.4 fL (ref 78.0–100.0)
Platelets: 173 10*3/uL (ref 150–400)
RBC: 3.65 MIL/uL — ABNORMAL LOW (ref 3.87–5.11)
RDW: 16.7 % — AB (ref 11.5–15.5)
WBC: 5.7 10*3/uL (ref 4.0–10.5)

## 2017-09-27 LAB — GLUCOSE, CAPILLARY
GLUCOSE-CAPILLARY: 90 mg/dL (ref 65–99)
GLUCOSE-CAPILLARY: 94 mg/dL (ref 65–99)
GLUCOSE-CAPILLARY: 125 mg/dL — AB (ref 65–99)
GLUCOSE-CAPILLARY: 143 mg/dL — AB (ref 65–99)

## 2017-09-27 LAB — MISC LABCORP TEST (SEND OUT): Labcorp test code: 183480

## 2017-09-27 MED ORDER — SODIUM CHLORIDE 0.9 % IV SOLN
50.0000 ug/h | INTRAVENOUS | Status: DC
  Administered 2017-09-27 – 2017-10-03 (×12): 50 ug/h via INTRAVENOUS
  Filled 2017-09-27 (×22): qty 1

## 2017-09-27 MED ORDER — LACTULOSE 10 GM/15ML PO SOLN
20.0000 g | Freq: Every day | ORAL | Status: DC
  Administered 2017-09-28 – 2017-10-04 (×7): 20 g via ORAL
  Filled 2017-09-27 (×8): qty 30

## 2017-09-27 MED ORDER — MIDODRINE HCL 5 MG PO TABS
10.0000 mg | ORAL_TABLET | Freq: Three times a day (TID) | ORAL | Status: DC
  Administered 2017-09-27 – 2017-10-03 (×17): 10 mg via ORAL
  Filled 2017-09-27 (×20): qty 2

## 2017-09-27 MED ORDER — ALBUMIN HUMAN 5 % IV SOLN
25.0000 g | Freq: Three times a day (TID) | INTRAVENOUS | Status: AC
  Administered 2017-09-27 – 2017-09-28 (×6): 25 g via INTRAVENOUS
  Filled 2017-09-27 (×6): qty 500

## 2017-09-27 NOTE — Progress Notes (Signed)
Admit: 09/24/2017 LOS: 3  57F with renal failure, appears AoCKD3 (SCr was 2s in 02/2017), AMS, suspected cirrhosis.  Found to have L renal atrophy unknown chronicity but suspect not new  Subjective:  More awake, alert this AM ORiented to self, county, year; better than previous Asterixus much improved MAR as outpt w/o nephrotoxins, no NSAIDS, no RAAS inhib, no ABX  03/13 0701 - 03/14 0700 In: 3842.1 [P.O.:890; I.V.:2952.1] Out: 200 [Urine:200]  Filed Weights   09/25/17 0436 09/26/17 0533 09/27/17 0500  Weight: 112.2 kg (247 lb 5.7 oz) 110.8 kg (244 lb 4.3 oz) 110.8 kg (244 lb 4.3 oz)    Scheduled Meds: . feeding supplement (GLUCERNA SHAKE)  237 mL Oral Q24H  . FLUoxetine  40 mg Oral Daily  . heparin  5,000 Units Subcutaneous Q8H  . insulin aspart  0-5 Units Subcutaneous QHS  . insulin aspart  0-9 Units Subcutaneous TID WC  . lactulose  20 g Oral TID  . levothyroxine  125 mcg Oral QAC breakfast  . rifaximin  550 mg Oral BID   Continuous Infusions: . lactated ringers 125 mL/hr at 09/27/17 0628   PRN Meds:.acetaminophen **OR** acetaminophen, ondansetron (ZOFRAN) IV  Current Labs: reviewed    Ref. Range 09/25/2017 16:00  Protein Creatinine Ratio Latest Ref Range: 0.00 - 0.15 mg/mgCre 0.15    Ref. Range 09/24/2017 19:01  Sodium, Urine Latest Units: mmol/L <10    Physical Exam:  Blood pressure 112/67, pulse 80, temperature (!) 97.5 F (36.4 C), temperature source Oral, resp. rate 18, height 5\' 8"  (1.727 m), weight 110.8 kg (244 lb 4.3 oz), SpO2 97 %. GEN: No acute distress, awake, chronically ill appearing ENT: Poor dentition, NCAT EYES: EOMI CV: RRR, no rub, no murmur PULM: Diminished breath sounds in the bases, normal respiratory effort ABD: Obese, soft, nontender, no hepatosplenomegaly SKIN: No rashes or lesions EXT: Trace edema NEURO: Asterixis very minimal today  A 1. AoCKD3 vs CKD progression; Low U Na, No proteinuria, NO obstruction on US.  Limited outpt  information available but she did have CKD3 with SCr around 1.8 late last year.  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 AMS, hepatic encephalopathy on lactulose + rifaximin 3. L renal atrophy based on US at admission 4. Cirrhosis 5. DM2 but A1c is normal 6. HTN, normal BPs  P 1. Really hasn't responded to crystalloid challenge; clearly has cirrhosis and came with low U Na and no identified nephrotoxin exposures; now I think has likely HRS.   2. Add midodrine and octreotide 3. Stop IVFs, will start albumin challenge 4. Daily weights, Daily Renal Panel, Strict I/Os, Avoid nephrotoxins (NSAIDs, judicious IV Contrast) 5. Will continue to follow   Sabra Heckyan Asusena Sigley MD 09/27/2017, 8:11 AM  Recent Labs  Lab 09/24/17 1734 09/25/17 0550 09/26/17 0444  NA 140 141 139  K 4.9 4.7 4.6  CL 107 109 110  CO2 23 23 21*  GLUCOSE 91 81 94  BUN 79* 82* 82*  CREATININE 5.39* 5.35* 5.27*  CALCIUM 8.7* 8.5* 8.1*   Recent Labs  Lab 09/24/17 1734 09/25/17 0550 09/26/17 0444  WBC 7.0 5.6 5.7  NEUTROABS 4.8  --   --   HGB 11.6* 9.7* 9.4*  HCT 35.4* 29.7* 28.7*  MCV 87.2 87.4 87.2  PLT 221 209 158

## 2017-09-27 NOTE — Progress Notes (Signed)
Patient Demographics:    [name], is a 57 y.o. female, DOB - 1961-03-27, ZOX:[id number]  Admit date - 09/24/2017   Admitting Physician Pearson Grippe, MD  Outpatient Primary MD for the patient is Patient, No Pcp Per  LOS - 3   Chief Complaint  Patient presents with  . Facial Swelling    right eye  . Abdominal Pain        Subjective:    [name] today has no fevers, no emesis,  No chest pain,  Prison guard at bedside , diarrhea persist (on lactulose),   Assessment  & Plan :    Principal Problem:   ARF (acute renal failure) (HCC) Active Problems:   Diarrhea   Anemia   Tachycardia   Abnormal liver function  Brief Summary:- 57 y/o female,w hypertension, hypothyroidism, ckd (unclear stage, no baseline creatinine), apparently presents w c/ochronic diarrhea for months as well as n/v a few days prior and some bilateral lower abdominal discomfort.  [facility] jail on 09/24/2017 with concerns about possible hepatic encephalopathy and renal failure   Assessment/Plan:  1)AKI on ckd (unknown stage)-records from [facility] suggest creatinine was 1.8 in August 2018,  ???  Hepatorenal syndrome ,   Hypoalbuminemia (uncelar if from cirrhosis or nephropathy).   Monitor I/o, urine output, renal labs.  Nephrology consult appreciated, renal ultrasound without obstructive uropathy, UA without proteinuria, urine sodium is low  2)DM- Reports taking metformin in the past.  Last A1c 4.3 continue sliding scale  3) hepatic encephalopathy in the setting of possible liver cirrhosis. She denies significant etoh use. ,  Abdominal ultrasound with asymptomatic Cholelithiasis. No s/s of cholecystitis, decrease lactulose to once daily as mental status has improved significantly.  Continue rifaximin  4)Hypothyroidism, tsh-6.2.   levothyroxine  Was increased to .   5)Anemia-suspect some degree of chronic  anemia due to CKD now with H&H trending down most likely from hemodilution with hydration  Code Status: full Family Communication: d/w patient, RN  Disposition Plan: pend further work up. Patient is from jail    DVT Prophylaxis  : Heparin  Lab Results  Component Value Date   PLT 173 09/27/2017    Inpatient Medications  Scheduled Meds: . feeding supplement (GLUCERNA SHAKE)  237 mL Oral Q24H  . FLUoxetine  40 mg Oral Daily  . heparin  5,000 Units Subcutaneous Q8H  . insulin aspart  0-5 Units Subcutaneous QHS  . insulin aspart  0-9 Units Subcutaneous TID WC  . [START ON 09/28/2017] lactulose  20 g Oral Daily  . levothyroxine  125 mcg Oral QAC breakfast  . midodrine  10 mg Oral TID WC  . rifaximin  550 mg Oral BID   Continuous Infusions: . albumin human    . octreotide  (SANDOSTATIN)    IV infusion 50 mcg/hr (09/27/17 1219)   PRN Meds:.acetaminophen **OR** acetaminophen, ondansetron (ZOFRAN) IV    Anti-infectives (From admission, onward)   Start     Dose/Rate Route Frequency Ordered Stop   09/25/17 2200  rifaximin (XIFAXAN) tablet 550 mg     550 mg Oral 2 times daily 09/25/17 1739          Objective:   Vitals:   09/26/17 2215 09/27/17 0450 09/27/17 0500 09/27/17 1408  BP: 111/67 112/67  107/72  Pulse: 77 80  83  Resp: 18 18  16   Temp: (!) 97.5 F (36.4 C) (!) 97.5 F (36.4 C)  98 F (36.7 C)  TempSrc: Oral Oral  Oral  SpO2: 99% 97%  99%  Weight:   110.8 kg (244 lb 4.3 oz)   Height:        Wt Readings from Last 3 Encounters:  09/27/17 110.8 kg (244 lb 4.3 oz)     Intake/Output Summary (Last 24 hours) at 09/27/2017 1921 Last data filed at 09/27/2017 1845 Gross per 24 hour  Intake 2575 ml  Output 250 ml  Net 2325 ml     Physical Exam  Gen:- Awake Alert,  In no apparent distress , obese HEENT:- [place].AT, No sclera icterus Neck-Supple Neck,No JVD,.  Lungs-  CTAB  CV- S1, S2 normal Abd-  +ve B.Sounds, Abd Soft, No tenderness, increased truncal  adiposity Extremity/Skin:- No  edema,    Psych-affect is appropriate, oriented x3 interactive Neuro-no new focal deficits, no tremors GU- foley with dark urine Rectal Tube with liquid stool   Data Review:   Micro Results Recent Results (from the past 240 hour(s))  MRSA PCR Screening     Status: None   Collection Time: 09/24/17 10:13 PM  Result Value Ref Range Status   MRSA by PCR NEGATIVE NEGATIVE Final    Comment:        The GeneXpert MRSA Assay (FDA approved for NASAL specimens only), is one component of a comprehensive MRSA colonization surveillance program. It is not intended to diagnose MRSA infection nor to guide or monitor treatment for MRSA infections. Performed at [facility], [address], [place], [place] [address]   C difficile quick scan w PCR reflex     Status: Abnormal   Collection Time: 09/26/17  2:42 AM  Result Value Ref Range Status   C Diff antigen POSITIVE (A) NEGATIVE Final   C Diff toxin NEGATIVE NEGATIVE Final   C Diff interpretation Results are indeterminate. See PCR results.  Final    Comment: Performed at [facility], [address], [place], [place] [address]  C. Diff by PCR, Reflexed     Status: None   Collection Time: 09/26/17  2:42 AM  Result Value Ref Range Status   Toxigenic C. Difficile by PCR NEGATIVE NEGATIVE Final    Comment: Patient is colonized with non toxigenic C. difficile. May not need treatment unless significant symptoms are present. Performed at [facility], [address] [place], [place] [address]     Radiology Reports US Abdomen Complete  Result Date: 09/25/2017 CLINICAL DATA:  Abnormal liver function tests, hypertension, diabetes EXAM: ABDOMEN ULTRASOUND COMPLETE COMPARISON:  None. FINDINGS: Gallbladder: There are gallstones present, the largest of 2.4 cm in diameter with acoustical shadowing. There is no pain over the gallbladder with compression. The gallbladder wall is  somewhat prominent measuring 4.5 mm. No pericholecystic fluid is seen. Common bile duct: Diameter: The common bile duct is normal measuring 5.2 mm in diameter. Liver: The liver is small and echogenic consistent with fatty infiltration. The contours are somewhat irregular which may indicate changes of cirrhosis. Correlate clinically. Portal vein is patent on color Doppler imaging with normal direction of blood flow towards the liver. IVC: No abnormality visualized. Pancreas: The pancreas is obscured by bowel gas. Spleen: The spleen measures 8.9 cm. Right Kidney: Length: 9.3 cm.  No hydronephrosis is seen. Left Kidney: Length: 6.4 cm.  The left kidney appears atrophic. No hydronephrosis is noted. Abdominal aorta: The abdominal aorta is normal in caliber. Other findings: There is a moderate amount of ascites throughout the peritoneal cavity. IMPRESSION: 1. Gallstones, the largest of 2.4 cm. No pain is present over the gallbladder currently with compression. 2. Echogenic liver parenchyma with the liver being small with somewhat nodular contour suggesting changes of cirrhosis. Correlate clinically. 3. The pancreas is obscured by bowel gas. 4. Atrophic appearing left kidney.  No hydronephrosis. 5. Moderate amount of ascites throughout the peritoneal cavity. Electronically Signed   By: Dwyane DeePaul  Barry M.D.   On: 09/25/2017 10:35     CBC Recent Labs  Lab 09/24/17 1734 09/25/17 0550 09/26/17 0444 09/27/17 0853  WBC 7.0 5.6 5.7 5.7  HGB 11.6* 9.7* 9.4* 10.3*  HCT 35.4* 29.7* 28.7* 31.9*  PLT 221 209 158 173  MCV 87.2 87.4 87.2 87.4  MCH 28.6 28.5 28.6 28.2  MCHC 32.8 32.7 32.8 32.3  RDW 16.3* 16.5* 16.7* 16.7*  LYMPHSABS 1.5  --   --   --   MONOABS 0.4  --   --   --   EOSABS 0.2  --   --   --   BASOSABS 0.1  --   --   --     Chemistries  Recent Labs  Lab 09/24/17 1734 09/25/17 0550 09/26/17 0444 09/27/17 0853  NA 140 141 139 141  K 4.9 4.7 4.6 4.1  CL 107 109 110 111  CO2 23 23 21* 20*  GLUCOSE  91 81 94 100*  BUN 79* 82* 82* 75*  CREATININE 5.39* 5.35* 5.27* 4.82*  CALCIUM 8.7* 8.5* 8.1* 8.3*  AST 87* 79* 66*  --   ALT 50 44 37  --   ALKPHOS 106 98 91  --   BILITOT 1.7* 1.4* 1.6*  --    ------------------------------------------------------------------------------------------------------------------ Recent Labs    09/25/17 0550  CHOL 141  HDL 25*  LDLCALC 101*  TRIG 77  CHOLHDL 5.6    Lab Results  Component Value Date   HGBA1C 4.3 (L) 09/25/2017   ------------------------------------------------------------------------------------------------------------------ Recent Labs    09/25/17 0558  TSH 6.297*   ------------------------------------------------------------------------------------------------------------------ No results for input(s): VITAMINB12, FOLATE, FERRITIN, TIBC, IRON, RETICCTPCT in the last 72 hours.  Coagulation profile Recent Labs  Lab 09/25/17 1239  INR 1.35    No results for input(s): DDIMER in the last 72 hours.  Cardiac Enzymes No results for input(s): CKMB, TROPONINI, MYOGLOBIN in the last 168 hours.  Invalid input(s): CK ------------------------------------------------------------------------------------------------------------------ No results found for: BNP   Shon Haleourage Isola Mehlman M.D on 09/27/2017 at 7:21 PM  Between 7am to 7pm - Pager - [phone]  After 7pm go to [email or web address] - password [email or web address]  [facility] -  Office  [phone]   Voice Recognition Reubin Milan/Dragon dictation system was used to create this note, attempts have been made to correct errors. Please contact the author with questions and/or clarifications.

## 2017-09-27 NOTE — Progress Notes (Signed)
Rectal tube inserted without difficulties or complication, patient tolerated well

## 2017-09-28 LAB — BASIC METABOLIC PANEL
Anion gap: 10 (ref 5–15)
BUN: 72 mg/dL — AB (ref 6–20)
CALCIUM: 8.1 mg/dL — AB (ref 8.9–10.3)
CO2: 19 mmol/L — ABNORMAL LOW (ref 22–32)
Chloride: 110 mmol/L (ref 101–111)
Creatinine, Ser: 5.01 mg/dL — ABNORMAL HIGH (ref 0.44–1.00)
GFR calc Af Amer: 10 mL/min — ABNORMAL LOW (ref >60)
GFR, EST NON AFRICAN AMERICAN: 9 mL/min — AB (ref >60)
Glucose, Bld: 129 mg/dL — ABNORMAL HIGH (ref 65–99)
POTASSIUM: 3.4 mmol/L — AB (ref 3.5–5.1)
Sodium: 139 mmol/L (ref 135–145)

## 2017-09-28 LAB — CBC
HCT: 29.9 % — ABNORMAL LOW (ref 36.0–46.0)
Hemoglobin: 9.6 g/dL — ABNORMAL LOW (ref 12.0–15.0)
MCH: 28.2 pg (ref 26.0–34.0)
MCHC: 32.1 g/dL (ref 30.0–36.0)
MCV: 87.9 fL (ref 78.0–100.0)
PLATELETS: 131 10*3/uL — AB (ref 150–400)
RBC: 3.4 MIL/uL — AB (ref 3.87–5.11)
RDW: 16.8 % — AB (ref 11.5–15.5)
WBC: 4.8 10*3/uL (ref 4.0–10.5)

## 2017-09-28 LAB — GLUCOSE, CAPILLARY
GLUCOSE-CAPILLARY: 126 mg/dL — AB (ref 65–99)
Glucose-Capillary: 122 mg/dL — ABNORMAL HIGH (ref 65–99)
Glucose-Capillary: 137 mg/dL — ABNORMAL HIGH (ref 65–99)

## 2017-09-28 MED ORDER — VANCOMYCIN 50 MG/ML ORAL SOLUTION: 125.0000 mg | Freq: Four times a day (QID) | ORAL | Status: DC

## 2017-09-28 MED ORDER — TRAMADOL HCL 50 MG PO TABS
50.0000 mg | ORAL_TABLET | Freq: Four times a day (QID) | ORAL | Status: AC | PRN
  Administered 2017-09-28 (×2): 50 mg via ORAL
  Filled 2017-09-28 (×2): qty 1

## 2017-09-28 NOTE — Progress Notes (Signed)
Resumed care of pt. Agree with previous RN's assessment. Will continue with plan of care for pt.

## 2017-09-28 NOTE — Progress Notes (Signed)
Patient Demographics:    [name], is a 57 y.o. female, DOB - [birth year], TKZ:[id number]  Admit date - 09/24/2017   Admitting Physician Pearson Grippe, MD  Outpatient Primary MD for the patient is Patient, No Pcp Per  LOS - 4   Chief Complaint  Patient presents with  . Facial Swelling    right eye  . Abdominal Pain        Subjective:    [name] today has no fevers, no emesis,  No chest pain,  Prison guard at bedside , diarrhea is better since decreasing lactulose frequency  Assessment  & Plan :    Principal Problem:   ARF (acute renal failure) (HCC) Active Problems:   Diarrhea   Anemia   Tachycardia   Abnormal liver function  Brief Summary:- 57 y/o female,w hypertension, hypothyroidism, ckd (unclear stage, no baseline creatinine), apparently presents w c/ochronic diarrhea for months as well as n/v a few days prior and some bilateral lower abdominal discomfort.  Admitted from jail on 09/24/2017 with concerns about possible hepatic encephalopathy and renal failure.    Assessment/Plan:  1)AKI on ckd (unknown stage)-records from [facility] suggest creatinine was 1.8 in August 2018,  ???  Hepatorenal syndrome , creatinine is currently around 5, creatinine had peaked at 5.39, hypoalbuminemia (uncelar if from cirrhosis or nephropathy).   Monitor I/o, urine output, renal labs.  Nephrology consult appreciated, renal ultrasound without obstructive uropathy, UA without proteinuria, urine sodium is low, nephrologist ordered IV albumin, maintain adequate hydration  2)DM- Reports taking metformin in the past.  Last A1c 4.3 continue sliding scale,   3)Hepatic encephalopathy in the setting of possible liver cirrhosis-cognitively much improved,  denies significant etoh use. ,  Abdominal ultrasound with asymptomatic Cholelithiasis. No s/s of cholecystitis, decrease lactulose to once daily as mental  status has improved significantly.  Continue rifaximin  4)Hypothyroidism, tsh-6.2.   levothyroxine  Was increased to .   5)Anemia-suspect some degree of chronic anemia due to CKD now with H&H trending down most likely from hemodilution with hydration  6)Diarrhea-patient apparently had diarrhea prior to admission, diarrhea got worse on lactulose here in the hospital, on 09/26/17 .Marland Kitchen..C. difficile antigen was positive, C. difficile toxin was negative and C. difficile PCR was negative.  Clinically doubt that patient has active C. difficile infection.  She is on rifaximin for hepatic encephalopathy which is not really a first-line treatment for C. difficile per se.   Code Status: full Family Communication: d/w patient, RN  Disposition Plan: pend further work up. Patient is from jail    DVT Prophylaxis  : Heparin  Lab Results  Component Value Date   PLT 131 (L) 09/28/2017    Inpatient Medications  Scheduled Meds: . feeding supplement (GLUCERNA SHAKE)  237 mL Oral Q24H  . FLUoxetine  40 mg Oral Daily  . heparin  5,000 Units Subcutaneous Q8H  . insulin aspart  0-5 Units Subcutaneous QHS  . insulin aspart  0-9 Units Subcutaneous TID WC  . lactulose  20 g Oral Daily  . levothyroxine  125 mcg Oral QAC breakfast  . midodrine  10 mg Oral TID WC  . rifaximin  550 mg Oral BID   Continuous Infusions: . albumin human    . octreotide  (  SANDOSTATIN)    IV infusion 50 mcg/hr (09/28/17 1042)   PRN Meds:.acetaminophen **OR** acetaminophen, ondansetron (ZOFRAN) IV, traMADol    Anti-infectives (From admission, onward)   Start     Dose/Rate Route Frequency Ordered Stop   09/28/17 1815  vancomycin (VANCOCIN) 50 mg/mL oral solution 125 mg  Status:  Discontinued     125 mg Oral Every 6 hours 09/28/17 1814 09/28/17 1815   09/25/17 2200  rifaximin (XIFAXAN) tablet 550 mg     550 mg Oral 2 times daily 09/25/17 1739          Objective:   Vitals:   09/27/17 1408 09/27/17 2125 09/28/17  0405 09/28/17 1313  BP: 107/72 107/62 110/67 107/67  Pulse: 83 89 70 70  Resp: 16 20 16 18   Temp: 98 F (36.7 C) 98.1 F (36.7 C) 97.7 F (36.5 C) 97.8 F (36.6 C)  TempSrc: Oral Oral Oral Oral  SpO2: 99% 98% 98% 100%  Weight:   120.1 kg (264 lb 12.4 oz)   Height:        Wt Readings from Last 3 Encounters:  09/28/17 120.1 kg (264 lb 12.4 oz)     Intake/Output Summary (Last 24 hours) at 09/28/2017 1816 Last data filed at 09/28/2017 1405 Gross per 24 hour  Intake 1779.58 ml  Output 200 ml  Net 1579.58 ml     Physical Exam  Gen:- Awake Alert,  In no apparent distress , obese HEENT:- [place].AT, No sclera icterus Neck-Supple Neck,No JVD,.  Lungs-  CTAB  CV- S1, S2 normal Abd-  +ve B.Sounds, Abd Soft, No tenderness, increased truncal adiposity Extremity/Skin:- No  edema,    Psych-affect is appropriate, oriented x3 interactive/talkative Neuro-no new focal deficits, no tremors GU- foley with dark urine    Data Review:   Micro Results Recent Results (from the past 240 hour(s))  MRSA PCR Screening     Status: None   Collection Time: 09/24/17 10:13 PM  Result Value Ref Range Status   MRSA by PCR NEGATIVE NEGATIVE Final    Comment:        The GeneXpert MRSA Assay (FDA approved for NASAL specimens only), is one component of a comprehensive MRSA colonization surveillance program. It is not intended to diagnose MRSA infection nor to guide or monitor treatment for MRSA infections. Performed at [facility], [address] [place], [place] [address]   C difficile quick scan w PCR reflex     Status: Abnormal   Collection Time: 09/26/17  2:42 AM  Result Value Ref Range Status   C Diff antigen POSITIVE (A) NEGATIVE Final   C Diff toxin NEGATIVE NEGATIVE Final   C Diff interpretation Results are indeterminate. See PCR results.  Final    Comment: Performed at [facility], [address] [place], [place] [address]  C. Diff by PCR,  Reflexed     Status: None   Collection Time: 09/26/17  2:42 AM  Result Value Ref Range Status   Toxigenic C. Difficile by PCR NEGATIVE NEGATIVE Final    Comment: Patient is colonized with non toxigenic C. difficile. May not need treatment unless significant symptoms are present. Performed at [facility], [address] [place], [place] [address]     Radiology Reports US Abdomen Complete  Result Date: 09/25/2017 CLINICAL DATA:  Abnormal liver function tests, hypertension, diabetes EXAM: ABDOMEN ULTRASOUND COMPLETE COMPARISON:  None. FINDINGS: Gallbladder: There are gallstones present, the largest of 2.4 cm in diameter with acoustical  shadowing. There is no pain over the gallbladder with compression. The gallbladder wall is somewhat prominent measuring 4.5 mm. No pericholecystic fluid is seen. Common bile duct: Diameter: The common bile duct is normal measuring 5.2 mm in diameter. Liver: The liver is small and echogenic consistent with fatty infiltration. The contours are somewhat irregular which may indicate changes of cirrhosis. Correlate clinically. Portal vein is patent on color Doppler imaging with normal direction of blood flow towards the liver. IVC: No abnormality visualized. Pancreas: The pancreas is obscured by bowel gas. Spleen: The spleen measures 8.9 cm. Right Kidney: Length: 9.3 cm.  No hydronephrosis is seen. Left Kidney: Length: 6.4 cm. The left kidney appears atrophic. No hydronephrosis is noted. Abdominal aorta: The abdominal aorta is normal in caliber. Other findings: There is a moderate amount of ascites throughout the peritoneal cavity. IMPRESSION: 1. Gallstones, the largest of 2.4 cm. No pain is present over the gallbladder currently with compression. 2. Echogenic liver parenchyma with the liver being small with somewhat nodular contour suggesting changes of cirrhosis. Correlate clinically. 3. The pancreas is obscured by bowel gas. 4. Atrophic appearing left kidney.  No  hydronephrosis. 5. Moderate amount of ascites throughout the peritoneal cavity. Electronically Signed   By: Dwyane Dee M.D.   On: 09/25/2017 10:35     CBC Recent Labs  Lab 09/24/17 1734 09/25/17 0550 09/26/17 0444 09/27/17 0853 09/28/17 1024  WBC 7.0 5.6 5.7 5.7 4.8  HGB 11.6* 9.7* 9.4* 10.3* 9.6*  HCT 35.4* 29.7* 28.7* 31.9* 29.9*  PLT 221 209 158 173 131*  MCV 87.2 87.4 87.2 87.4 87.9  MCH 28.6 28.5 28.6 28.2 28.2  MCHC 32.8 32.7 32.8 32.3 32.1  RDW 16.3* 16.5* 16.7* 16.7* 16.8*  LYMPHSABS 1.5  --   --   --   --   MONOABS 0.4  --   --   --   --   EOSABS 0.2  --   --   --   --   BASOSABS 0.1  --   --   --   --     Chemistries  Recent Labs  Lab 09/24/17 1734 09/25/17 0550 09/26/17 0444 09/27/17 0853 09/28/17 1024  NA 140 141 139 141 139  K 4.9 4.7 4.6 4.1 3.4*  CL 107 109 110 111 110  CO2 23 23 21* 20* 19*  GLUCOSE 91 81 94 100* 129*  BUN 79* 82* 82* 75* 72*  CREATININE 5.39* 5.35* 5.27* 4.82* 5.01*  CALCIUM 8.7* 8.5* 8.1* 8.3* 8.1*  AST 87* 79* 66*  --   --   ALT 50 44 37  --   --   ALKPHOS 106 98 91  --   --   BILITOT 1.7* 1.4* 1.6*  --   --    ------------------------------------------------------------------------------------------------------------------ No results for input(s): CHOL, HDL, LDLCALC, TRIG, CHOLHDL, LDLDIRECT in the last 72 hours.  Lab Results  Component Value Date   HGBA1C 4.3 (L) 09/25/2017   ------------------------------------------------------------------------------------------------------------------ No results for input(s): TSH, T4TOTAL, T3FREE, THYROIDAB in the last 72 hours.  Invalid input(s): FREET3 ------------------------------------------------------------------------------------------------------------------ No results for input(s): VITAMINB12, FOLATE, FERRITIN, TIBC, IRON, RETICCTPCT in the last 72 hours.  Coagulation profile Recent Labs  Lab 09/25/17 1239  INR 1.35    No results for input(s): DDIMER in the last 72  hours.  Cardiac Enzymes No results for input(s): CKMB, TROPONINI, MYOGLOBIN in the last 168 hours.  Invalid input(s): CK ------------------------------------------------------------------------------------------------------------------ No results found for: BNP   Shon Hale M.D on 09/28/2017  at 6:16 PM  Between 7am to 7pm - Pager - [phone]  After 7pm go to [email or web address] - password [email or web address]  [facility] -  Office  [phone]   Voice Recognition Reubin Milan/Dragon dictation system was used to create this note, attempts have been made to correct errors. Please contact the author with questions and/or clarifications.

## 2017-09-28 NOTE — Progress Notes (Signed)
Admit: 09/24/2017 LOS: 4  72F with renal failure, appears AoCKD3 (SCr was 2s in 02/2017), AMS, suspected cirrhosis.  Found to have L renal atrophy unknown chronicity but suspect not new  Subjective:  No new events On midodrine / octreotide / albumin  03/14 0701 - 03/15 0700 In: 1337.1 [P.O.:960; I.V.:377.1] Out: 450 [Urine:250; Stool:200]  Filed Weights   09/26/17 0533 09/27/17 0500 09/28/17 0405  Weight: 110.8 kg (244 lb 4.3 oz) 110.8 kg (244 lb 4.3 oz) 120.1 kg (264 lb 12.4 oz)    Scheduled Meds: . feeding supplement (GLUCERNA SHAKE)  237 mL Oral Q24H  . FLUoxetine  40 mg Oral Daily  . heparin  5,000 Units Subcutaneous Q8H  . insulin aspart  0-5 Units Subcutaneous QHS  . insulin aspart  0-9 Units Subcutaneous TID WC  . lactulose  20 g Oral Daily  . levothyroxine  125 mcg Oral QAC breakfast  . midodrine  10 mg Oral TID WC  . rifaximin  550 mg Oral BID   Continuous Infusions: . albumin human    . octreotide  (SANDOSTATIN)    IV infusion 50 mcg/hr (09/28/17 1042)   PRN Meds:.acetaminophen **OR** acetaminophen, ondansetron (ZOFRAN) IV, traMADol  Current Labs: reviewed    Ref. Range 09/25/2017 16:00  Protein Creatinine Ratio Latest Ref Range: 0.00 - 0.15 mg/mgCre 0.15    Ref. Range 09/24/2017 19:01  Sodium, Urine Latest Units: mmol/L <10    Physical Exam:  Blood pressure 110/67, pulse 70, temperature 97.7 F (36.5 C), temperature source Oral, resp. rate 16, height 5\' 8"  (1.727 m), weight 120.1 kg (264 lb 12.4 oz), SpO2 98 %. GEN: No acute distress, awake, chronically ill appearing ENT: Poor dentition, NCAT EYES: EOMI CV: RRR, no rub, no murmur PULM: Diminished breath sounds in the bases, normal respiratory effort ABD: Obese, soft, nontender, no hepatosplenomegaly SKIN: No rashes or lesions EXT: Trace edema NEURO: Asterixis very minimal today  A 1. AoCKD3 vs CKD progression; Low U Na, No proteinuria, NO obstruction on US.  Limited outpt information available but she  did have CKD3 with SCr around 1.8 late last year.  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 AMS, hepatic encephalopathy on lactulose + rifaximin 3. L renal atrophy based on US at admission 4. Cirrhosis 5. DM2 but A1c is normal 6. HTN, normal BPs  P 1. Cont HRS therapies, hopefully will have some recovery; hasnt' worsened at least 2. Would be poor candidate for dialysis 3. Daily weights, Daily Renal Panel, Strict I/Os, Avoid nephrotoxins (NSAIDs, judicious IV Contrast) 4. Will continue to follow   Sabra Heckyan Lasharn Bufkin MD 09/28/2017, 12:56 PM  Recent Labs  Lab 09/26/17 0444 09/27/17 0853 09/28/17 1024  NA 139 141 139  K 4.6 4.1 3.4*  CL 110 111 110  CO2 21* 20* 19*  GLUCOSE 94 100* 129*  BUN 82* 75* 72*  CREATININE 5.27* 4.82* 5.01*  CALCIUM 8.1* 8.3* 8.1*  PHOS  --  4.0  --    Recent Labs  Lab 09/24/17 1734  09/26/17 0444 09/27/17 0853 09/28/17 1024  WBC 7.0   < > 5.7 5.7 4.8  NEUTROABS 4.8  --   --   --   --   HGB 11.6*   < > 9.4* 10.3* 9.6*  HCT 35.4*   < > 28.7* 31.9* 29.9*  MCV 87.2   < > 87.2 87.4 87.9  PLT 221   < > 158 173 131*   < > =  values in this interval not displayed.             

## 2017-09-29 LAB — GLUCOSE, CAPILLARY
GLUCOSE-CAPILLARY: 118 mg/dL — AB (ref 65–99)
GLUCOSE-CAPILLARY: 126 mg/dL — AB (ref 65–99)
Glucose-Capillary: 114 mg/dL — ABNORMAL HIGH (ref 65–99)
Glucose-Capillary: 120 mg/dL — ABNORMAL HIGH (ref 65–99)
Glucose-Capillary: 155 mg/dL — ABNORMAL HIGH (ref 65–99)

## 2017-09-29 LAB — BASIC METABOLIC PANEL
Anion gap: 10 (ref 5–15)
BUN: 64 mg/dL — ABNORMAL HIGH (ref 6–20)
CHLORIDE: 111 mmol/L (ref 101–111)
CO2: 18 mmol/L — AB (ref 22–32)
CREATININE: 4.75 mg/dL — AB (ref 0.44–1.00)
Calcium: 8.3 mg/dL — ABNORMAL LOW (ref 8.9–10.3)
GFR calc non Af Amer: 9 mL/min — ABNORMAL LOW (ref >60)
GFR, EST AFRICAN AMERICAN: 11 mL/min — AB (ref >60)
Glucose, Bld: 141 mg/dL — ABNORMAL HIGH (ref 65–99)
POTASSIUM: 3.2 mmol/L — AB (ref 3.5–5.1)
Sodium: 139 mmol/L (ref 135–145)

## 2017-09-29 MED ORDER — POTASSIUM CHLORIDE CRYS ER 20 MEQ PO TBCR
40.0000 meq | EXTENDED_RELEASE_TABLET | Freq: Once | ORAL | Status: DC
  Filled 2017-09-29: qty 2

## 2017-09-29 NOTE — Progress Notes (Signed)
Admit: 09/24/2017 LOS: 5  37F with renal failure, appears AoCKD3 (SCr was 2s in 02/2017), AMS, suspected cirrhosis.  Found to have L renal atrophy unknown chronicity but suspect not new  Subjective:  No new events AM labs pending On midodrine / octreotide / albumin  03/15 0701 - 03/16 0700 In: 1905 [P.O.:240; I.V.:665; IV Piggyback:1000] Out: 475 [Urine:475]  Filed Weights   09/27/17 0500 09/28/17 0405 09/29/17 0644  Weight: 110.8 kg (244 lb 4.3 oz) 120.1 kg (264 lb 12.4 oz) 121.2 kg (267 lb 3.2 oz)    Scheduled Meds: . feeding supplement (GLUCERNA SHAKE)  237 mL Oral Q24H  . FLUoxetine  40 mg Oral Daily  . heparin  5,000 Units Subcutaneous Q8H  . insulin aspart  0-5 Units Subcutaneous QHS  . insulin aspart  0-9 Units Subcutaneous TID WC  . lactulose  20 g Oral Daily  . levothyroxine  125 mcg Oral QAC breakfast  . midodrine  10 mg Oral TID WC  . rifaximin  550 mg Oral BID   Continuous Infusions: . octreotide  (SANDOSTATIN)    IV infusion 50 mcg/hr (09/29/17 0642)   PRN Meds:.acetaminophen **OR** acetaminophen, ondansetron (ZOFRAN) IV  Current Labs: reviewed    Ref. Range 09/25/2017 16:00  Protein Creatinine Ratio Latest Ref Range: 0.00 - 0.15 mg/mgCre 0.15    Ref. Range 09/24/2017 19:01  Sodium, Urine Latest Units: mmol/L <10    Physical Exam:  Blood pressure 107/70, pulse 70, temperature 98 F (36.7 C), resp. rate 18, height 5\' 8"  (1.727 m), weight 121.2 kg (267 lb 3.2 oz), SpO2 97 %. GEN: No acute distress, awake, chronically ill appearing ENT: Poor dentition, NCAT EYES: EOMI CV: RRR, no rub, no murmur PULM: Diminished breath sounds in the bases, normal respiratory effort ABD: Obese, soft, nontender, no hepatosplenomegaly SKIN: No rashes or lesions EXT: Trace edema NEURO: Asterixis very minimal today  A 1. AoCKD3 vs CKD progression; Low U Na, No proteinuria, NO obstruction on US.  Limited outpt information available but she did have CKD3 with SCr around 1.8  late last year.  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 AMS, hepatic encephalopathy on lactulose + rifaximin 3. L renal atrophy based on US at admission unclear when this occurred, doubt anything acutely 4. Cirrhosis 5. DM2 but A1c is normal 6. HTN, normal BPs no meds  P 1. Cont HRS therapies, hopefully will have some recovery; hasnt' worsened at least 2. Await AM labs 3. Stop albumin after 2d today 4. Would be poor candidate for dialysis 5. Daily weights, Daily Renal Panel, Strict I/Os, Avoid nephrotoxins (NSAIDs, judicious IV Contrast) 6. Will continue to follow   Sabra Heckyan Iviona Hole MD 09/29/2017, 10:17 AM  Recent Labs  Lab 09/26/17 0444 09/27/17 0853 09/28/17 1024  NA 139 141 139  K 4.6 4.1 3.4*  CL 110 111 110  CO2 21* 20* 19*  GLUCOSE 94 100* 129*  BUN 82* 75* 72*  CREATININE 5.27* 4.82* 5.01*  CALCIUM 8.1* 8.3* 8.1*  PHOS  --  4.0  --    Recent Labs  Lab 09/24/17 1734  09/26/17 0444 09/27/17 0853 09/28/17 1024  WBC 7.0   < > 5.7 5.7 4.8  NEUTROABS 4.8  --   --   --   --   HGB 11.6*   < > 9.4* 10.3* 9.6*  HCT 35.4*   < > 28.7* 31.9* 29.9*  MCV 87.2   < > 87.2 87.4 87.9  PLT  221   < > 158 173 131*   < > = values in this interval not displayed.

## 2017-09-29 NOTE — Progress Notes (Signed)
Patient Demographics:    [name], is a 57 y.o. female, DOB - [birth year], UEA:[id number]  Admit date - 09/24/2017   Admitting Physician Pearson Grippe, MD  Outpatient Primary MD for the patient is Patient, No Pcp Per  LOS - 5   Chief Complaint  Patient presents with  . Facial Swelling    right eye  . Abdominal Pain        Subjective:    [name] today has no fevers, no emesis,  No chest pain,  Prison guard at bedside , diarrhea is better since decreasing lactulose frequency, Dr Marisue Humble at bedside  Assessment  & Plan :    Principal Problem:   ARF (acute renal failure) (HCC) Active Problems:   Diarrhea   Anemia   Tachycardia   Abnormal liver function  Brief Summary:- 57 y/o female,w hypertension, hypothyroidism, ckd (unclear stage, no baseline creatinine), apparently presents w c/ochronic diarrhea for months as well as n/v a few days prior and some bilateral lower abdominal discomfort.  Admitted from jail on 09/24/2017 with concerns about possible hepatic encephalopathy and renal failure.    Assessment/Plan:  1)AKI on ckd (unknown stage)-records from [facility] suggest creatinine was 1.8 in August 2018,  ???  Hepatorenal syndrome , creatinine is down to 4.75, UOP has increased, creatinine had peaked at 5.39, hypoalbuminemia (uncelar if from cirrhosis or nephropathy). C/n to  Monitor I/o, urine output, renal labs.  Nephrology consult appreciated, renal ultrasound without obstructive uropathy, UA without proteinuria, urine sodium is low, complete IV albumin on 09/29/17, on octreotide, maintain adequate hydration  2)DM- Reports taking metformin in the past.  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 No s/s of cholecystitis, decrease lactulose to once daily as mental status has improved significantly.  Continue rifaximin  4)Hypothyroidism, tsh-6.2.   levothyroxine  Was increased to .   5)Anemia-suspect some degree of chronic anemia due to CKD now with H&H trending down most likely from hemodilution with hydration  6)Diarrhea- Much improved diarrahea, c/n Lactulose once daily (goal is 2 to 3 BMs /day), patient apparently had diarrhea prior to admission, diarrhea got worse on lactulose here in the hospital, on 09/26/17 .Marland Kitchen..C. difficile antigen was positive, C. difficile toxin was negative and C. difficile PCR was negative.  Clinically doubt that patient has active C. difficile infection.  She is on rifaximin for hepatic encephalopathy which is not really a first-line treatment for C. difficile per se.   7)Hypotension-continue midodrine for pressure support,  patient is currently on octreotide drip  Code Status: full Family Communication: d/w patient,  Disposition Plan: pend further work up. Patient is from jail    DVT Prophylaxis  : Heparin  Lab Results  Component Value Date   PLT 131 (L) 09/28/2017   Inpatient Medications  Scheduled Meds: . feeding supplement (GLUCERNA SHAKE)  237 mL Oral Q24H  . FLUoxetine  40 mg Oral Daily  . heparin  5,000 Units Subcutaneous Q8H  . insulin aspart  0-5 Units Subcutaneous QHS  . insulin  aspart  0-9 Units Subcutaneous TID WC  . lactulose  20 g Oral Daily  . levothyroxine  125 mcg Oral QAC breakfast  . midodrine  10 mg Oral TID WC  . potassium chloride  40 mEq Oral Once  . potassium chloride  40 mEq Oral Once  . rifaximin  550 mg Oral BID   Continuous Infusions: . octreotide  (SANDOSTATIN)    IV infusion 50 mcg/hr (09/29/17 0642)   PRN Meds:.acetaminophen **OR** acetaminophen, ondansetron (ZOFRAN) IV    Anti-infectives (From admission, onward)   Start      Dose/Rate Route Frequency Ordered Stop   09/28/17 1815  vancomycin (VANCOCIN) 50 mg/mL oral solution 125 mg  Status:  Discontinued     125 mg Oral Every 6 hours 09/28/17 1814 09/28/17 1815   09/25/17 2200  rifaximin (XIFAXAN) tablet 550 mg     550 mg Oral 2 times daily 09/25/17 1739          Objective:   Vitals:   09/28/17 1313 09/28/17 2150 09/29/17 0644 09/29/17 1539  BP: 107/67 (!) 91/57 107/70 95/66  Pulse: 70 74 70 62  Resp: 18 18 18 18   Temp: 97.8 F (36.6 C) 98.7 F (37.1 C) 98 F (36.7 C) 97.6 F (36.4 C)  TempSrc: Oral Oral  Oral  SpO2: 100% 100% 97% 100%  Weight:   121.2 kg (267 lb 3.2 oz)   Height:        Wt Readings from Last 3 Encounters:  09/29/17 121.2 kg (267 lb 3.2 oz)    Intake/Output Summary (Last 24 hours) at 09/29/2017 1609 Last data filed at 09/29/2017 0644 Gross per 24 hour  Intake 982.5 ml  Output 475 ml  Net 507.5 ml   Physical Exam  Gen:- Awake Alert,  In no apparent distress , obese HEENT:- [place].AT, No sclera icterus Neck-Supple Neck,No JVD,.  Lungs-  CTAB  CV- S1, S2 normal Abd-  +ve B.Sounds, Abd Soft, No tenderness, increased truncal adiposity Extremity/Skin:- No  edema,    Psych-affect is appropriate, oriented x3 interactive/talkative Neuro-no new focal deficits, no tremors GU- foley with dark urine    Data Review:   Micro Results Recent Results (from the past 240 hour(s))  MRSA PCR Screening     Status: None   Collection Time: 09/24/17 10:13 PM  Result Value Ref Range Status   MRSA by PCR NEGATIVE NEGATIVE Final    Comment:        The GeneXpert MRSA Assay (FDA approved for NASAL specimens only), is one component of a comprehensive MRSA colonization surveillance program. It is not intended to diagnose MRSA infection nor to guide or monitor treatment for MRSA infections. Performed at [facility], [address], [place], [place] [address]   C difficile quick scan w PCR reflex     Status: Abnormal    Collection Time: 09/26/17  2:42 AM  Result Value Ref Range Status   C Diff antigen POSITIVE (A) NEGATIVE Final   C Diff toxin NEGATIVE NEGATIVE Final   C Diff interpretation Results are indeterminate. See PCR results.  Final    Comment: Performed at [facility], [address], [place], [place] [address]  C. Diff by PCR, Reflexed     Status: None   Collection Time: 09/26/17  2:42 AM  Result Value Ref Range Status   Toxigenic C. Difficile by PCR NEGATIVE NEGATIVE Final    Comment: Patient is colonized with non toxigenic C. difficile. May not need  treatment unless significant symptoms are present. Performed at [facility], [address] [place], [place] [address]     Radiology Reports US Abdomen Complete  Result Date: 09/25/2017 CLINICAL DATA:  Abnormal liver function tests, hypertension, diabetes EXAM: ABDOMEN ULTRASOUND COMPLETE COMPARISON:  None. FINDINGS: Gallbladder: There are gallstones present, the largest of 2.4 cm in diameter with acoustical shadowing. There is no pain over the gallbladder with compression. The gallbladder wall is somewhat prominent measuring 4.5 mm. No pericholecystic fluid is seen. Common bile duct: Diameter: The common bile duct is normal measuring 5.2 mm in diameter. Liver: The liver is small and echogenic consistent with fatty infiltration. The contours are somewhat irregular which may indicate changes of cirrhosis. Correlate clinically. Portal vein is patent on color Doppler imaging with normal direction of blood flow towards the liver. IVC: No abnormality visualized. Pancreas: The pancreas is obscured by bowel gas. Spleen: The spleen measures 8.9 cm. Right Kidney: Length: 9.3 cm.  No hydronephrosis is seen. Left Kidney: Length: 6.4 cm. The left kidney appears atrophic. No hydronephrosis is noted. Abdominal aorta: The abdominal aorta is normal in caliber. Other findings: There is a moderate amount of ascites throughout the peritoneal  cavity. IMPRESSION: 1. Gallstones, the largest of 2.4 cm. No pain is present over the gallbladder currently with compression. 2. Echogenic liver parenchyma with the liver being small with somewhat nodular contour suggesting changes of cirrhosis. Correlate clinically. 3. The pancreas is obscured by bowel gas. 4. Atrophic appearing left kidney.  No hydronephrosis. 5. Moderate amount of ascites throughout the peritoneal cavity. Electronically Signed   By: Dwyane Dee M.D.   On: 09/25/2017 10:35     CBC Recent Labs  Lab 09/24/17 1734 09/25/17 0550 09/26/17 0444 09/27/17 0853 09/28/17 1024  WBC 7.0 5.6 5.7 5.7 4.8  HGB 11.6* 9.7* 9.4* 10.3* 9.6*  HCT 35.4* 29.7* 28.7* 31.9* 29.9*  PLT 221 209 158 173 131*  MCV 87.2 87.4 87.2 87.4 87.9  MCH 28.6 28.5 28.6 28.2 28.2  MCHC 32.8 32.7 32.8 32.3 32.1  RDW 16.3* 16.5* 16.7* 16.7* 16.8*  LYMPHSABS 1.5  --   --   --   --   MONOABS 0.4  --   --   --   --   EOSABS 0.2  --   --   --   --   BASOSABS 0.1  --   --   --   --     Chemistries  Recent Labs  Lab 09/24/17 1734 09/25/17 0550 09/26/17 0444 09/27/17 0853 09/28/17 1024 09/29/17 1056  NA 140 141 139 141 139 139  K 4.9 4.7 4.6 4.1 3.4* 3.2*  CL 107 109 110 111 110 111  CO2 23 23 21* 20* 19* 18*  GLUCOSE 91 81 94 100* 129* 141*  BUN 79* 82* 82* 75* 72* 64*  CREATININE 5.39* 5.35* 5.27* 4.82* 5.01* 4.75*  CALCIUM 8.7* 8.5* 8.1* 8.3* 8.1* 8.3*  AST 87* 79* 66*  --   --   --   ALT 50 44 37  --   --   --   ALKPHOS 106 98 91  --   --   --   BILITOT 1.7* 1.4* 1.6*  --   --   --    ------------------------------------------------------------------------------------------------------------------ No results for input(s): CHOL, HDL, LDLCALC, TRIG, CHOLHDL, LDLDIRECT in the last 72 hours.  Lab Results  Component Value Date   HGBA1C 4.3 (L) 09/25/2017   ------------------------------------------------------------------------------------------------------------------ No results for  input(s): TSH,  T4TOTAL, T3FREE, THYROIDAB in the last 72 hours.  Invalid input(s): FREET3 ------------------------------------------------------------------------------------------------------------------ No results for input(s): VITAMINB12, FOLATE, FERRITIN, TIBC, IRON, RETICCTPCT in the last 72 hours.  Coagulation profile Recent Labs  Lab 09/25/17 1239  INR 1.35    No results for input(s): DDIMER in the last 72 hours.  Cardiac Enzymes No results for input(s): CKMB, TROPONINI, MYOGLOBIN in the last 168 hours.  Invalid input(s): CK ------------------------------------------------------------------------------------------------------------------ No results found for: BNP   Shon Haleourage Shakeera Rightmyer M.D on 09/29/2017 at 4:09 PM  Between 7am to 7pm - Pager - [phone]  After 7pm go to [email or web address] - password [email or web address]  [facility] -  Office  [phone]   Voice Recognition Reubin Milan/Dragon dictation system was used to create this note, attempts have been made to correct errors. Please contact the author with questions and/or clarifications.

## 2017-09-30 LAB — BASIC METABOLIC PANEL
Anion gap: 9 (ref 5–15)
BUN: 64 mg/dL — AB (ref 6–20)
CALCIUM: 8.3 mg/dL — AB (ref 8.9–10.3)
CO2: 18 mmol/L — ABNORMAL LOW (ref 22–32)
CREATININE: 4.43 mg/dL — AB (ref 0.44–1.00)
Chloride: 114 mmol/L — ABNORMAL HIGH (ref 101–111)
GFR calc Af Amer: 12 mL/min — ABNORMAL LOW (ref >60)
GFR, EST NON AFRICAN AMERICAN: 10 mL/min — AB (ref >60)
GLUCOSE: 115 mg/dL — AB (ref 65–99)
Potassium: 3.6 mmol/L (ref 3.5–5.1)
SODIUM: 141 mmol/L (ref 135–145)

## 2017-09-30 LAB — GLUCOSE, CAPILLARY
GLUCOSE-CAPILLARY: 166 mg/dL — AB (ref 65–99)
Glucose-Capillary: 98 mg/dL (ref 65–99)
Glucose-Capillary: 125 mg/dL — ABNORMAL HIGH (ref 65–99)
Glucose-Capillary: 127 mg/dL — ABNORMAL HIGH (ref 65–99)

## 2017-09-30 NOTE — Progress Notes (Signed)
[name] Demographics:    [name], is a 57 y.o. female, DOB - 1960-09-02, ZOX:[id number]  Admit date - 09/24/2017   Admitting Physician Pearson Grippe, MD  Outpatient Primary MD for the [name] is [name], No Pcp Per  LOS - 6   Chief Complaint  [name] presents with  . Facial Swelling    right eye  . Abdominal Pain        Subjective:    [name] today has no fevers, no emesis,  No chest pain,  Prison guard at bedside , UOP about , eating ok , [name] has some abdominal discomfort  Assessment  & Plan :    Principal Problem:   ARF (acute renal failure) (HCC) Active Problems:   Diarrhea   Anemia   Tachycardia   Abnormal liver function  Brief Summary:- 57 y/o female,w hypertension, hypothyroidism, ckd (unclear stage, no baseline creatinine), apparently presents w c/ochronic diarrhea for months as well as n/v a few days prior and some bilateral lower abdominal discomfort.  Admitted from jail on 09/24/2017 with concerns about possible hepatic encephalopathy and renal failure.    Assessment/Plan:  1)AKI on ckd (unknown stage)-UOP about , records from [facility] suggest creatinine was 1.8 in August 2018,  ???  Hepatorenal syndrome , creatinine is down to 4.45, UOP has increased, creatinine had peaked at 5.39, hypoalbuminemia (uncelar if from cirrhosis or nephropathy). C/n to  Monitor I/o, urine output, renal labs.  Nephrology consult appreciated, renal ultrasound without obstructive uropathy, UA without proteinuria, urine sodium is low, completed IV albumin on 09/29/17, on octreotide, maintain adequate hydration, continue midodrine/octreotide per nephrology  2)DM- Reports taking metformin in the past.  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 No s/s of cholecystitis, c/n lactulose, Continue rifaximin  4)Hypothyroidism, tsh-6.2.   levothyroxine  Was increased to .   5)Anemia-suspect some degree of chronic anemia due to CKD, monitor H&H and transfuse as clinically indicated  6)Diarrhea- Much improved diarrahea, c/n Lactulose once daily (goal is 2 to 3 BMs /day), [name] apparently had diarrhea prior to admission, diarrhea got worse on lactulose here in the hospital, on 09/26/17 .Marland Kitchen..C. difficile antigen was positive, C. difficile toxin was negative and C. difficile PCR was negative.  Clinically doubt that [name] has active C. difficile infection.  She is on rifaximin for hepatic encephalopathy which is not really a first-line treatment for C. difficile per se.   7)Hypotension-continue midodrine/octreotide per nephrology  Code Status: full Family Communication: d/w [name],  Disposition Plan: pend further work up. [name] is from jail    DVT Prophylaxis  : Heparin  Lab Results  Component Value Date   PLT 131 (L) 09/28/2017   Inpatient Medications  Scheduled Meds: . feeding supplement (GLUCERNA SHAKE)  237 mL Oral Q24H  . FLUoxetine  40 mg Oral Daily  . heparin  5,000 Units Subcutaneous Q8H  . insulin aspart  0-5 Units Subcutaneous QHS  . insulin aspart  0-9 Units Subcutaneous TID WC  . lactulose  20 g Oral Daily  . levothyroxine  125 mcg Oral QAC breakfast  . midodrine  10 mg Oral TID WC  . potassium chloride  40 mEq Oral Once  . potassium chloride  40 mEq Oral Once  . rifaximin  550 mg Oral BID   Continuous Infusions: . octreotide  (SANDOSTATIN)    IV infusion 50 mcg/hr (09/30/17 0015)   PRN Meds:.acetaminophen **OR** acetaminophen, ondansetron (ZOFRAN) IV    Anti-infectives (From admission, onward)   Start     Dose/Rate Route Frequency Ordered Stop   09/28/17  1815  vancomycin (VANCOCIN) 50 mg/mL oral solution 125 mg  Status:  Discontinued     125 mg Oral Every 6 hours 09/28/17 1814 09/28/17 1815   09/25/17 2200  rifaximin (XIFAXAN) tablet 550 mg     550 mg Oral 2 times daily 09/25/17 1739          Objective:   Vitals:   09/29/17 2127 09/30/17 0528 09/30/17 0529 09/30/17 1313  BP: 109/73 103/69  98/70  Pulse: 63 67  67  Resp: 18 20  16   Temp: 97.7 F (36.5 C) 98.1 F (36.7 C)  98 F (36.7 C)  TempSrc: Oral Oral  Oral  SpO2: 98% 99%  100%  Weight:   121.6 kg (268 lb 1.3 oz)   Height:        Wt Readings from Last 3 Encounters:  09/30/17 121.6 kg (268 lb 1.3 oz)    Intake/Output Summary (Last 24 hours) at 09/30/2017 1543 Last data filed at 09/30/2017 1000 Gross per 24 hour  Intake 360 ml  Output 450 ml  Net -90 ml   Physical Exam  Gen:- Awake Alert,  In no apparent distress , obese HEENT:- [place].AT, No sclera icterus Neck-Supple Neck,No JVD,.  Lungs-  CTAB  CV- S1, S2 normal Abd-  +ve B.Sounds, Abd Soft, No significant tenderness, increased truncal adiposity Extremity/Skin:- No  edema,    Psych-affect is appropriate, oriented x3 interactive/talkative Neuro-no new focal deficits, no tremors GU- foley with dark urine    Data Review:   Micro Results Recent Results (from the past 240 hour(s))  MRSA PCR Screening     Status: None   Collection Time: 09/24/17 10:13 PM  Result Value Ref Range Status   MRSA by PCR NEGATIVE NEGATIVE Final    Comment:        The GeneXpert MRSA Assay (FDA approved for NASAL specimens only), is one component of a comprehensive MRSA colonization surveillance program. It is not intended to diagnose MRSA infection nor to guide or monitor treatment for MRSA infections. Performed at [facility], [address] [place], [place] [address]   C difficile quick scan w PCR reflex     Status: Abnormal   Collection Time: 09/26/17  2:42 AM  Result Value Ref Range Status   C Diff  antigen POSITIVE (A) NEGATIVE Final   C Diff toxin NEGATIVE NEGATIVE Final   C Diff interpretation Results are indeterminate. See PCR results.  Final    Comment: Performed at [facility], [address], [place], [place] [address]  C. Diff by PCR, Reflexed     Status: None   Collection Time: 09/26/17  2:42 AM  Result Value Ref Range Status   Toxigenic C. Difficile by PCR NEGATIVE NEGATIVE Final    Comment: [name] is colonized with non toxigenic C. difficile. May not need treatment unless significant symptoms are present. Performed at [facility], [address], [place], [place] [address]     Radiology Reports US Abdomen Complete  Result Date: 09/25/2017 CLINICAL DATA:  Abnormal liver function tests, hypertension, diabetes EXAM: ABDOMEN ULTRASOUND COMPLETE COMPARISON:  None. FINDINGS: Gallbladder: There are gallstones present, the largest of 2.4 cm in diameter with acoustical shadowing. There is no pain over the gallbladder with compression. The gallbladder wall is somewhat prominent measuring 4.5 mm. No pericholecystic fluid is seen. Common bile duct: Diameter: The common bile duct is normal measuring 5.2 mm in diameter. Liver: The liver is small and echogenic consistent with fatty infiltration. The contours are somewhat irregular which may indicate changes of cirrhosis. Correlate clinically. Portal vein is patent on color Doppler imaging with normal direction of blood flow towards the liver. IVC: No abnormality visualized. Pancreas: The pancreas is obscured by bowel gas. Spleen: The spleen measures 8.9 cm. Right Kidney: Length: 9.3 cm.  No hydronephrosis is seen. Left Kidney: Length: 6.4 cm. The left kidney appears atrophic. No hydronephrosis is noted. Abdominal aorta: The abdominal aorta is normal in caliber. Other findings: There is a moderate amount of ascites throughout the peritoneal cavity. IMPRESSION: 1. Gallstones, the largest of 2.4 cm. No pain is present over  the gallbladder currently with compression. 2. Echogenic liver parenchyma with the liver being small with somewhat nodular contour suggesting changes of cirrhosis. Correlate clinically. 3. The pancreas is obscured by bowel gas. 4. Atrophic appearing left kidney.  No hydronephrosis. 5. Moderate amount of ascites throughout the peritoneal cavity. Electronically Signed   By: Dwyane Dee M.D.   On: 09/25/2017 10:35     CBC Recent Labs  Lab 09/24/17 1734 09/25/17 0550 09/26/17 0444 09/27/17 0853 09/28/17 1024  WBC 7.0 5.6 5.7 5.7 4.8  HGB 11.6* 9.7* 9.4* 10.3* 9.6*  HCT 35.4* 29.7* 28.7* 31.9* 29.9*  PLT 221 209 158 173 131*  MCV 87.2 87.4 87.2 87.4 87.9  MCH 28.6 28.5 28.6 28.2 28.2  MCHC 32.8 32.7 32.8 32.3 32.1  RDW 16.3* 16.5* 16.7* 16.7* 16.8*  LYMPHSABS 1.5  --   --   --   --   MONOABS 0.4  --   --   --   --   EOSABS 0.2  --   --   --   --   BASOSABS 0.1  --   --   --   --     Chemistries  Recent Labs  Lab 09/24/17 1734 09/25/17 0550 09/26/17 0444 09/27/17 0853 09/28/17 1024 09/29/17 1056 09/30/17 0527  NA 140 141 139 141 139 139 141  K 4.9 4.7 4.6 4.1 3.4* 3.2* 3.6  CL 107 109 110 111 110 111 114*  CO2 23 23 21* 20* 19* 18* 18*  GLUCOSE 91 81 94 100* 129* 141* 115*  BUN 79* 82* 82* 75* 72* 64* 64*  CREATININE 5.39* 5.35* 5.27* 4.82* 5.01* 4.75* 4.43*  CALCIUM 8.7* 8.5* 8.1* 8.3* 8.1* 8.3* 8.3*  AST 87* 79* 66*  --   --   --   --   ALT 50 44 37  --   --   --   --   ALKPHOS 106 98 91  --   --   --   --   BILITOT 1.7* 1.4* 1.6*  --   --   --   --    ------------------------------------------------------------------------------------------------------------------ No results for input(s): CHOL, HDL, LDLCALC, TRIG, CHOLHDL, LDLDIRECT in the last 72 hours.  Lab Results  Component Value Date   HGBA1C 4.3 (L) 09/25/2017   ------------------------------------------------------------------------------------------------------------------  No results for input(s): TSH,  T4TOTAL, T3FREE, THYROIDAB in the last 72 hours.  Invalid input(s): FREET3 ------------------------------------------------------------------------------------------------------------------ No results for input(s): VITAMINB12, FOLATE, FERRITIN, TIBC, IRON, RETICCTPCT in the last 72 hours.  Coagulation profile Recent Labs  Lab 09/25/17 1239  INR 1.35    No results for input(s): DDIMER in the last 72 hours.  Cardiac Enzymes No results for input(s): CKMB, TROPONINI, MYOGLOBIN in the last 168 hours.  Invalid input(s): CK ------------------------------------------------------------------------------------------------------------------ No results found for: BNP   Shon Haleourage Edgel Degnan M.D on 09/30/2017 at 3:43 PM  Between 7am to 7pm - Pager - [phone]  After 7pm go to [email or web address] - password [email or web address]  [facility] -  Office  [phone]   Voice Recognition Reubin Milan/Dragon dictation system was used to create this note, attempts have been made to correct errors. Please contact the author with questions and/or clarifications.

## 2017-09-30 NOTE — Progress Notes (Signed)
Admit: 09/24/2017 LOS: 6  44F with renal failure, appears AoCKD3 (SCr was 2s in 02/2017), AMS, suspected cirrhosis.  Found to have L renal atrophy unknown chronicity but suspect not new  Subjective:  SCR with some improvement UOP around 0.5L On midodrine / octreotide  Some abd pain today  03/16 0701 - 03/17 0700 In: 680 [P.O.:480; I.V.:200] Out: 450 [Urine:450]  Filed Weights   09/28/17 0405 09/29/17 0644 09/30/17 0529  Weight: 120.1 kg (264 lb 12.4 oz) 121.2 kg (267 lb 3.2 oz) 121.6 kg (268 lb 1.3 oz)    Scheduled Meds: . feeding supplement (GLUCERNA SHAKE)  237 mL Oral Q24H  . FLUoxetine  40 mg Oral Daily  . heparin  5,000 Units Subcutaneous Q8H  . insulin aspart  0-5 Units Subcutaneous QHS  . insulin aspart  0-9 Units Subcutaneous TID WC  . lactulose  20 g Oral Daily  . levothyroxine  125 mcg Oral QAC breakfast  . midodrine  10 mg Oral TID WC  . potassium chloride  40 mEq Oral Once  . potassium chloride  40 mEq Oral Once  . rifaximin  550 mg Oral BID   Continuous Infusions: . octreotide  (SANDOSTATIN)    IV infusion 50 mcg/hr (09/30/17 0015)   PRN Meds:.acetaminophen **OR** acetaminophen, ondansetron (ZOFRAN) IV  Current Labs: reviewed    Ref. Range 09/25/2017 16:00  Protein Creatinine Ratio Latest Ref Range: 0.00 - 0.15 mg/mgCre 0.15    Ref. Range 09/24/2017 19:01  Sodium, Urine Latest Units: mmol/L <10    Physical Exam:  Blood pressure 103/69, pulse 67, temperature 98.1 F (36.7 C), temperature source Oral, resp. rate 20, height 5\' 8"  (1.727 m), weight 121.6 kg (268 lb 1.3 oz), SpO2 99 %. GEN: No acute distress, awake, chronically ill appearing ENT: Poor dentition, NCAT EYES: EOMI CV: RRR, no rub, no murmur PULM: Diminished breath sounds in the bases, normal respiratory effort ABD: Obese, soft, nontender, no hepatosplenomegaly SKIN: No rashes or lesions EXT: Trace edema NEURO: Asterixis very minimal today  A 1. AoCKD3 vs CKD progression; Low U Na, No  proteinuria, NO obstruction on US.  Limited outpt information available but she did have CKD3 with SCr around 1.8 late last year.  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 AMS, hepatic encephalopathy on lactulose + rifaximin improved 3. L renal atrophy based on US at admission unclear when this occurred, doubt anything acutely 4. Cirrhosis 5. DM2 but A1c is normal 6. HTN, normal BPs no meds  P 1. Cont HRS therapies, seeing some improvement 2. Would be poor candidate for dialysis and appears she will not immediately require 3. Daily weights, Daily Renal Panel, Strict I/Os, Avoid nephrotoxins (NSAIDs, judicious IV Contrast) 4. Will continue to follow   Sabra Heckyan Larsen Zettel MD 09/30/2017, 10:24 AM  Recent Labs  Lab 09/27/17 0853 09/28/17 1024 09/29/17 1056 09/30/17 0527  NA 141 139 139 141  K 4.1 3.4* 3.2* 3.6  CL 111 110 111 114*  CO2 20* 19* 18* 18*  GLUCOSE 100* 129* 141* 115*  BUN 75* 72* 64* 64*  CREATININE 4.82* 5.01* 4.75* 4.43*  CALCIUM 8.3* 8.1* 8.3* 8.3*  PHOS 4.0  --   --   --    Recent Labs  Lab 09/24/17 1734  09/26/17 0444 09/27/17 0853 09/28/17 1024  WBC 7.0   < > 5.7 5.7 4.8  NEUTROABS 4.8  --   --   --   --   HGB 11.6*   < >  9.4* 10.3* 9.6*  HCT 35.4*   < > 28.7* 31.9* 29.9*  MCV 87.2   < > 87.2 87.4 87.9  PLT 221   < > 158 173 131*   < > = values in this interval not displayed.

## 2017-10-01 LAB — COMPREHENSIVE METABOLIC PANEL
ALBUMIN: 2.4 g/dL — AB (ref 3.5–5.0)
ALK PHOS: 51 U/L (ref 38–126)
ALT: 25 U/L (ref 14–54)
ANION GAP: 8 (ref 5–15)
AST: 51 U/L — ABNORMAL HIGH (ref 15–41)
BUN: 62 mg/dL — AB (ref 6–20)
CALCIUM: 8.2 mg/dL — AB (ref 8.9–10.3)
CO2: 17 mmol/L — AB (ref 22–32)
CREATININE: 4.23 mg/dL — AB (ref 0.44–1.00)
Chloride: 115 mmol/L — ABNORMAL HIGH (ref 101–111)
GFR calc Af Amer: 12 mL/min — ABNORMAL LOW (ref >60)
GFR calc non Af Amer: 11 mL/min — ABNORMAL LOW (ref >60)
GLUCOSE: 120 mg/dL — AB (ref 65–99)
Potassium: 3.7 mmol/L (ref 3.5–5.1)
SODIUM: 140 mmol/L (ref 135–145)
Total Bilirubin: 1.5 mg/dL — ABNORMAL HIGH (ref 0.3–1.2)
Total Protein: 5.2 g/dL — ABNORMAL LOW (ref 6.5–8.1)

## 2017-10-01 LAB — GLUCOSE, CAPILLARY
GLUCOSE-CAPILLARY: 108 mg/dL — AB (ref 65–99)
GLUCOSE-CAPILLARY: 118 mg/dL — AB (ref 65–99)
Glucose-Capillary: 110 mg/dL — ABNORMAL HIGH (ref 65–99)
Glucose-Capillary: 119 mg/dL — ABNORMAL HIGH (ref 65–99)

## 2017-10-01 LAB — CBC
HCT: 31.1 % — ABNORMAL LOW (ref 36.0–46.0)
HEMOGLOBIN: 9.9 g/dL — AB (ref 12.0–15.0)
MCH: 28.4 pg (ref 26.0–34.0)
MCHC: 31.8 g/dL (ref 30.0–36.0)
MCV: 89.4 fL (ref 78.0–100.0)
Platelets: 157 10*3/uL (ref 150–400)
RBC: 3.48 MIL/uL — ABNORMAL LOW (ref 3.87–5.11)
RDW: 17.8 % — AB (ref 11.5–15.5)
WBC: 7.2 10*3/uL (ref 4.0–10.5)

## 2017-10-01 NOTE — Evaluation (Signed)
Occupational Therapy Evaluation Patient Details Name: [name] MRN: [id number] DOB: 30-Nov-1960 Today's Date: 10/01/2017    History of Present Illness Pt is a 57 year old female with hypertension, hypothyroidism, CKD, apparently presents w c/o chronic diarrhea for months as well as n/v a few days prior and some bilateral lower abdominal discomfort.  Pt admitted from jail on 09/24/2017 with concerns about possible hepatic encephalopathy and renal failure.    Clinical Impression   Pt admitted with abdominal discomfort. Pt currently with functional limitations due to the deficits listed below (see OT Problem List).  Pt will benefit from skilled OT to increase their safety and independence with ADL and functional mobility for ADL to facilitate discharge to venue listed below.     Follow Up Recommendations  SNF(or setting like this as pt in custody)    Equipment Recommendations  None recommended by OT    Recommendations for Other Services       Precautions / Restrictions Precautions Precautions: Fall      Mobility Bed Mobility Overal bed mobility: Needs Assistance Bed Mobility: Supine to Sit     Supine to sit: Mod assist;HOB elevated     General bed mobility comments: pt in chair  Transfers Overall transfer level: Needs assistance Equipment used: Rolling walker (2 wheeled) Transfers: Sit to/from Stand Sit to Stand: Total assist Stand pivot transfers: Total assist       General transfer comment: unable to get into full standing position- will need lift back to bed    Balance Overall balance assessment: Needs assistance         Standing balance support: Bilateral upper extremity supported Standing balance-Leahy Scale: Poor                             ADL either performed or assessed with clinical judgement   ADL Overall ADL's : Needs assistance/impaired Eating/Feeding: Minimal assistance;Sitting   Grooming: Minimal assistance;Sitting   Upper  Body Bathing: Moderate assistance;Sitting   Lower Body Bathing: Total assistance;Sit to/from stand;Cueing for sequencing;Cueing for compensatory techniques                         General ADL Comments: pt will need significant A with ADL activity at DC location ( toileting, bathing, hygiene)     Vision Patient Visual Report: No change from baseline              Pertinent Vitals/Pain Pain Assessment: Faces Faces Pain Scale: Hurts little more Pain Location: chronic L knee Pain Descriptors / Indicators: Sore Pain Intervention(s): Limited activity within patient's tolerance     Hand Dominance     Extremity/Trunk Assessment Upper Extremity Assessment Upper Extremity Assessment: Generalized weakness   Lower Extremity Assessment Lower Extremity Assessment: Generalized weakness;LLE deficits/detail LLE Deficits / Details: L knee with crepitus sound with mobility, observed functional use however not specifically tested, pt reports chronic L knee pain       Communication Communication Communication: No difficulties   Cognition Arousal/Alertness: Awake/alert Behavior During Therapy: WFL for tasks assessed/performed Overall Cognitive Status: Within Functional Limits for tasks assessed                                                Home Living Family/patient expects to be discharged to:: Dentention/Prison(will need significant  A with ADL activity )                                        Prior Functioning/Environment                   OT Problem List: Decreased strength;Decreased activity tolerance;Decreased safety awareness;Decreased knowledge of use of DME or AE;Impaired balance (sitting and/or standing);Decreased knowledge of precautions      OT Treatment/Interventions: Self-care/ADL training;Patient/family education;DME and/or AE instruction    OT Goals(Current goals can be found in the care plan section) Acute Rehab OT  Goals Patient Stated Goal: get stronger OT Goal Formulation: With patient Time For Goal Achievement: 10/08/17 Potential to Achieve Goals: Good  OT Frequency: Min 2X/week   Barriers to D/C: Decreased caregiver support          Co-evaluation              AM-PAC PT "6 Clicks" Daily Activity     Outcome Measure Help from another person eating meals?: A Little Help from another person taking care of personal grooming?: A Little Help from another person toileting, which includes using toliet, bedpan, or urinal?: Total Help from another person bathing (including washing, rinsing, drying)?: A Lot Help from another person to put on and taking off regular upper body clothing?: A Lot Help from another person to put on and taking off regular lower body clothing?: Total 6 Click Score: 12   End of Session Equipment Utilized During Treatment: Rolling walker Nurse Communication: Mobility status;Need for lift equipment  Activity Tolerance: Patient limited by fatigue Patient left: in chair  OT Visit Diagnosis: Unsteadiness on feet (R26.81);Muscle weakness (generalized) (M62.81);History of falling (Z91.81);Other abnormalities of gait and mobility (R26.89)                Time: [id number] OT Time Calculation (min): 22 min Charges:  OT General Charges $OT Visit: 1 Visit OT Evaluation $OT Eval Moderate Complexity: 1 Mod G-Codes:     Lise AuerLori Eudell Julian, [place]  [phone]  [name], [name] D 10/01/2017, 5:27 PM

## 2017-10-01 NOTE — Evaluation (Signed)
Physical Therapy Evaluation Patient Details Name: [name] MRN: [id number] DOB: [birth year] Today's Date: 10/01/2017   History of Present Illness  Pt is a 57 year old female with hypertension, hypothyroidism, CKD, apparently presents w c/o chronic diarrhea for months as well as n/v a few days prior and some bilateral lower abdominal discomfort.  Pt admitted from jail on 09/24/2017 with concerns about possible hepatic encephalopathy and renal failure.   Clinical Impression  Pt admitted with above diagnosis. Pt currently with functional limitations due to the deficits listed below (see PT Problem List).  Pt will benefit from skilled PT to increase their independence and safety with mobility to allow discharge to the venue listed below.  Pt assisted OOB over to [facility] and then recliner.  Pt presents with generalized weakness and poor mobility at this time.  Pt admitted from prison.     Follow Up Recommendations Other (comment)(from prison, poor mobility at this time)    Equipment Recommendations  Rolling walker with 5" wheels    Recommendations for Other Services       Precautions / Restrictions Precautions Precautions: Fall      Mobility  Bed Mobility Overal bed mobility: Needs Assistance Bed Mobility: Supine to Sit     Supine to sit: Mod assist;HOB elevated     General bed mobility comments: verbal cues for technique, assist for scooting out to EOB  Transfers Overall transfer level: Needs assistance Equipment used: Rolling walker (2 wheeled) Transfers: Sit to/from [employer] Sit to Stand: Mod assist Stand pivot transfers: Mod assist       General transfer comment: verbal cues for hand placement, assist to rise and steady, assist for LE buckling, pt assisted to [facility] and then over to recliner, NT assisted with pericare  Ambulation/Gait                Stairs            Wheelchair Mobility    Modified Rankin (Stroke Patients Only)        Balance Overall balance assessment: Needs assistance         Standing balance support: Bilateral upper extremity supported Standing balance-Leahy Scale: Poor                               Pertinent Vitals/Pain Pain Assessment: Faces Faces Pain Scale: Hurts little more Pain Location: chronic L knee Pain Descriptors / Indicators: Sore Pain Intervention(s): Limited activity within patient's tolerance;Repositioned;Monitored during session    Home Living Family/patient expects to be discharged to:: Dentention/Prison                      Prior Function                 Hand Dominance        Extremity/Trunk Assessment        Lower Extremity Assessment Lower Extremity Assessment: Generalized weakness;LLE deficits/detail LLE Deficits / Details: L knee with crepitus sound with mobility, observed functional use however not specifically tested, pt reports chronic L knee pain       Communication   Communication: No difficulties  Cognition Arousal/Alertness: Awake/alert Behavior During Therapy: WFL for tasks assessed/performed Overall Cognitive Status: Within Functional Limits for tasks assessed  General Comments      Exercises     Assessment/Plan    PT Assessment Patient needs continued PT services  PT Problem List Decreased strength;Decreased mobility;Decreased activity tolerance;Decreased balance;Decreased knowledge of use of DME       PT Treatment Interventions Gait training;Therapeutic activities;DME instruction;Therapeutic exercise;Patient/family education;Balance training    PT Goals (Current goals can be found in the Care Plan section)  Acute Rehab PT Goals PT Goal Formulation: With patient Time For Goal Achievement: 10/15/17 Potential to Achieve Goals: Good    Frequency Min 3X/week   Barriers to discharge        Co-evaluation               AM-PAC PT "6  Clicks" Daily Activity  Outcome Measure Difficulty turning over in bed (including adjusting bedclothes, sheets and blankets)?: Unable Difficulty moving from lying on back to sitting on the side of the bed? : Unable Difficulty sitting down on and standing up from a chair with arms (e.g., wheelchair, bedside commode, etc,.)?: Unable Help needed moving to and from a bed to chair (including a wheelchair)?: Total Help needed walking in hospital room?: Total Help needed climbing 3-5 steps with a railing? : Total 6 Click Score: 6    End of Session Equipment Utilized During Treatment: Gait belt Activity Tolerance: Patient limited by fatigue Patient left: in chair;with chair alarm set;with call bell/phone within reach;Other (comment)(prison guard present) Nurse Communication: Mobility status PT Visit Diagnosis: Other abnormalities of gait and mobility (R26.89);Muscle weakness (generalized) (M62.81)    Time: [id number] PT Time Calculation (min) (ACUTE ONLY): 19 min   Charges:   PT Evaluation $PT Eval Low Complexity: 1 Low     PT G Codes:        Zenovia JarredKati Pearce [place], PT, DPT 10/01/2017 Pager: [phone]  Maida SaleLEMYRE,KATHrine E 10/01/2017, 4:32 PM

## 2017-10-01 NOTE — Progress Notes (Signed)
Admit: 09/24/2017 LOS: 7  48F with renal failure, appears AoCKD3 (SCr was 2s in 02/2017), AMS, suspected cirrhosis.  Found to have L renal atrophy unknown chronicity but suspect not new  Subjective:  No new c/o's, creat down to 4.23.  UOp remains poor at 300 cc/ day.     03/17 0701 - 03/18 0700 In: 360 [P.O.:360] Out: 300 [Urine:300]  Filed Weights   09/29/17 0644 09/30/17 0529 10/01/17 0447  Weight: 121.2 kg (267 lb 3.2 oz) 121.6 kg (268 lb 1.3 oz) 121.3 kg (267 lb 6.7 oz)    Scheduled Meds: . feeding supplement (GLUCERNA SHAKE)  237 mL Oral Q24H  . FLUoxetine  40 mg Oral Daily  . heparin  5,000 Units Subcutaneous Q8H  . insulin aspart  0-5 Units Subcutaneous QHS  . insulin aspart  0-9 Units Subcutaneous TID WC  . lactulose  20 g Oral Daily  . levothyroxine  125 mcg Oral QAC breakfast  . midodrine  10 mg Oral TID WC  . potassium chloride  40 mEq Oral Once  . potassium chloride  40 mEq Oral Once  . rifaximin  550 mg Oral BID   Continuous Infusions: . octreotide  (SANDOSTATIN)    IV infusion 50 mcg/hr (09/30/17 2317)   PRN Meds:.acetaminophen **OR** acetaminophen, ondansetron (ZOFRAN) IV  Current Labs: reviewed    Ref. Range 09/25/2017 16:00  Protein Creatinine Ratio Latest Ref Range: 0.00 - 0.15 mg/mgCre 0.15    Ref. Range 09/24/2017 19:01  Sodium, Urine Latest Units: mmol/L <10    Physical Exam:  Blood pressure 117/64, pulse 67, temperature 98 F (36.7 C), temperature source Oral, resp. rate 18, height 5\' 8"  (1.727 m), weight 121.3 kg (267 lb 6.7 oz), SpO2 99 %. GEN: No acute distress, awake, chronically ill appearing ENT: Poor dentition, NCAT EYES: EOMI CV: RRR, no rub, no murmur PULM: Diminished breath sounds in the bases, normal respiratory effort ABD: Obese, soft, nontender, no hepatosplenomegaly SKIN: No rashes or lesions EXT: bilat anasarca, 3+ edema bilat LE's NEURO: Asterixis very minimal today  A 1. AoCKD3 vs CKD progression; Low U Na, No proteinuria,  NO obstruction on [place].  SCr Reportedly had creat around 1.8 late last year.  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  Creat coming down, slowly, getting Rx for HRS with midodrine/ octreotide and sp IV albumin infusions.  D# 5 Rx today.  2. AMS - hepatic encephalopathy on lactulose + rifaximin, improved 3. L renal atrophy - based on [place] at admission, unclear when this occurred, doubt anything acutely 4. Cirrhosis 5. DM2 but A1c is normal 6. HTN, normal BPs no meds  P 1. Cont HRS therapies, D#5 2. Poor candidate for dialysis 3. Approached EOL issues briefly , HRS is a sign of near end-stage liver disease usually.  Will ask palliative care to consult for GOC.  4. Daily weights, Daily Renal Panel, Strict I/Os, Avoid nephrotoxins (NSAIDs, judicious IV Contrast) 5. Will continue to follow     Vinson Moselle MD [employer] pgr [phone]   10/01/2017, 3:17 PM  Recent Labs  Lab 09/27/17 0853  09/29/17 1056 09/30/17 0527 10/01/17 0440  NA 141   < > 139 141 140  K 4.1   < > 3.2* 3.6 3.7  CL 111   < > 111 114* 115*  CO2 20*   < > 18* 18* 17*  GLUCOSE 100*   < > 141* 115* 120*  BUN 75*   < > 64* 64* 62*  CREATININE  4.82*   < > 4.75* 4.43* 4.23*  CALCIUM 8.3*   < > 8.3* 8.3* 8.2*  PHOS 4.0  --   --   --   --    < > = values in this interval not displayed.   Recent Labs  Lab 09/24/17 1734  09/27/17 0853 09/28/17 1024 10/01/17 0440  WBC 7.0   < > 5.7 4.8 7.2  NEUTROABS 4.8  --   --   --   --   HGB 11.6*   < > 10.3* 9.6* 9.9*  HCT 35.4*   < > 31.9* 29.9* 31.1*  MCV 87.2   < > 87.4 87.9 89.4  PLT 221   < > 173 131* 157   < > = values in this interval not displayed.

## 2017-10-01 NOTE — Progress Notes (Signed)
Patient Demographics:    [name], is a 57 y.o. female, DOB - 04-27-1961, ZOX:[id number]  Admit date - 09/24/2017   Admitting Physician Pearson GrippeJames Kim, MD  Outpatient Primary MD for the patient is Patient, No Pcp Per  LOS - 7   Chief Complaint  Patient presents with  . Facial Swelling    right eye  . Abdominal Pain        Subjective:    [name] today has no fevers, no emesis,  No chest pain,  Prison guard at bedside , UOP about 300ml , eating ok ,  Pt is tearful when asked about code status  Assessment  & Plan :    Principal Problem:   ARF (acute renal failure) (HCC) Active Problems:   Diarrhea   Anemia   Tachycardia   Abnormal liver function  Brief Summary:- 57 y/o female,w hypertension, hypothyroidism, ckd (unclear stage, no baseline creatinine), apparently presents w c/ochronic diarrhea for months as well as n/v a few days prior and some bilateral lower abdominal discomfort.  [facility] with concerns about possible Hepatic Encephalopathy and Renal Failure (suspect hepatorenal syndrome)   Assessment/Plan:  1)AKI on ckd (unknown stage)-UOP about 300ml , [facility] suggest creatinine was 1.8 in August 2018, suspect hepatorenal syndrome , creatinine is down to 4.2, UOP is not great,  creatinine had peaked at 5.39, hypoalbuminemia (uncelar if from cirrhosis or nephropathy). C/n to  Monitor I/o, urine output, renal labs.  Nephrology consult appreciated, renal ultrasound without obstructive uropathy, UA without proteinuria, urine sodium is low, completed IV albumin on 09/29/17, on octreotide, maintain adequate hydration, continue midodrine/octreotide per nephrology  2)DM- Reports taking metformin in the past.  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 Ammonia had peaked at 143,  c/n Lactulose once daily (goal is 2 to 3 BMs /day), ,  Abdominal ultrasound with asymptomatic Cholelithiasis. No s/s of cholecystitis, c/n lactulose and rifaximin  4)Hypothyroidism, tsh-6.2.   levothyroxine  Was increased to 125mcg.   5)Anemia-suspect some degree of chronic anemia due to CKD, monitor H&H and transfuse as clinically indicated  6)Diarrhea- Much improved,  c/n Lactulose once daily (goal is 2 to 3 BMs /day), patient apparently had diarrhea prior to admission, diarrhea got worse on lactulose here in the hospital, on 09/26/17 .Marland Kitchen...C. difficile antigen was positive, C. difficile toxin was negative and C. difficile PCR was negative.  Clinically doubt that patient has active C. difficile infection.  She is on rifaximin for hepatic encephalopathy which is not really a first-line treatment for C. difficile per se.   7)Hypotension-continue midodrine/octreotide per nephrology  8)Social/Ethics-patient is a full code at this time, patient would like to talk to her sister prior to making any decisions about end-of-life issues, however failure my discharge/presents this time need to give up admission to call her sister spoke with prison guards to help facilitate phone calls with patient's family surgical make decisions about end-of-life issues.  Palliative care consult requested to Help determine/delineate Goals of care  9) generalized weakness/debility-physical therapy evaluation appreciated, recommend rolling walker  Code Status: full Family Communication: d/w patient,  Disposition Plan: pend further work up. Patient is from [facility]   DVT Prophylaxis  : Heparin  Lab Results  Component Value Date   PLT 157 10/01/2017   Inpatient Medications  Scheduled Meds: . feeding supplement (GLUCERNA SHAKE)  237 mL Oral Q24H  . FLUoxetine  40 mg Oral Daily  . heparin  5,000 Units Subcutaneous Q8H  . insulin  aspart  0-5 Units Subcutaneous QHS  . insulin aspart  0-9 Units Subcutaneous TID WC  . lactulose  20 g Oral Daily  . levothyroxine  125 mcg Oral QAC breakfast  . midodrine  10 mg Oral TID WC  . potassium chloride  40 mEq Oral Once  . potassium chloride  40 mEq Oral Once  . rifaximin  550 mg Oral BID   Continuous Infusions: . octreotide  (SANDOSTATIN)    IV infusion 50 mcg/hr (10/01/17 1637)   PRN Meds:.acetaminophen **OR** acetaminophen, ondansetron (ZOFRAN) IV    Anti-infectives (From admission, onward)   Start     Dose/Rate Route Frequency Ordered Stop   09/28/17 1815  vancomycin (VANCOCIN) 50 mg/mL oral solution 125 mg  Status:  Discontinued     125 mg Oral Every 6 hours 09/28/17 1814 09/28/17 1815   09/25/17 2200  rifaximin (XIFAXAN) tablet 550 mg     550 mg Oral 2 times daily 09/25/17 1739          Objective:   Vitals:   09/30/17 0529 09/30/17 1313 09/30/17 2241 10/01/17 0447  BP:  98/70 115/66 117/64  Pulse:  67 67 67  Resp:  16 16 18   Temp:  98 F (36.7 C) 97.7 F (36.5 C) 98 F (36.7 C)  TempSrc:  Oral Oral Oral  SpO2:  100% 100% 99%  Weight: 121.6 kg (268 lb 1.3 oz)   121.3 kg (267 lb 6.7 oz)  Height:        Wt Readings from Last 3 Encounters:  10/01/17 121.3 kg (267 lb 6.7 oz)    Intake/Output Summary (Last 24 hours) at 10/01/2017 1648 Last data filed at 09/30/2017 2241 Gross per 24 hour  Intake 120 ml  Output 300 ml  Net -180 ml   Physical Exam  Gen:- Awake Alert,  In no apparent distress , obese HEENT:- [place].AT, No sclera icterus Neck-Supple Neck,No JVD,.  Lungs-  CTAB , no wheezing CV- S1, S2 normal Abd-  +ve B.Sounds, Abd Soft, No significant tenderness, increased truncal adiposity Extremity/Skin:- No  edema,    Psych- oriented x3 interactive/talkative, patient is tearful at times Neuro-no new focal deficits, no tremors GU- foley with dark urine    Data Review:   Micro Results Recent Results (from the past 240 hour(s))  MRSA PCR  Screening     Status: None   Collection Time: 09/24/17 10:13 PM  Result Value Ref Range Status   MRSA by PCR NEGATIVE NEGATIVE Final    Comment:        The GeneXpert MRSA Assay (FDA approved for NASAL specimens only), is one component of a comprehensive MRSA colonization surveillance program. It is not intended to diagnose MRSA infection nor to guide or monitor treatment for MRSA infections. Performed at [facility], [address] [place], [place] [address]   C difficile quick scan w PCR reflex     Status: Abnormal   Collection Time: 09/26/17  2:42 AM  Result Value Ref Range Status   C Diff antigen POSITIVE (A) NEGATIVE Final   C Diff toxin NEGATIVE NEGATIVE  Final   C Diff interpretation Results are indeterminate. See PCR results.  Final    Comment: Performed at [facility], [address] [place], [place] [address]  C. Diff by PCR, Reflexed     Status: None   Collection Time: 09/26/17  2:42 AM  Result Value Ref Range Status   Toxigenic C. Difficile by PCR NEGATIVE NEGATIVE Final    Comment: Patient is colonized with non toxigenic C. difficile. May not need treatment unless significant symptoms are present. Performed at [facility], [address] [place], [place] [address]     Radiology Reports US Abdomen Complete  Result Date: 09/25/2017 CLINICAL DATA:  Abnormal liver function tests, hypertension, diabetes EXAM: ABDOMEN ULTRASOUND COMPLETE COMPARISON:  None. FINDINGS: Gallbladder: There are gallstones present, the largest of 2.4 cm in diameter with acoustical shadowing. There is no pain over the gallbladder with compression. The gallbladder wall is somewhat prominent measuring 4.5 mm. No pericholecystic fluid is seen. Common bile duct: Diameter: The common bile duct is normal measuring 5.2 mm in diameter. Liver: The liver is small and echogenic consistent with fatty infiltration. The contours are somewhat irregular which may  indicate changes of cirrhosis. Correlate clinically. Portal vein is patent on color Doppler imaging with normal direction of blood flow towards the liver. IVC: No abnormality visualized. Pancreas: The pancreas is obscured by bowel gas. Spleen: The spleen measures 8.9 cm. Right Kidney: Length: 9.3 cm.  No hydronephrosis is seen. Left Kidney: Length: 6.4 cm. The left kidney appears atrophic. No hydronephrosis is noted. Abdominal aorta: The abdominal aorta is normal in caliber. Other findings: There is a moderate amount of ascites throughout the peritoneal cavity. IMPRESSION: 1. Gallstones, the largest of 2.4 cm. No pain is present over the gallbladder currently with compression. 2. Echogenic liver parenchyma with the liver being small with somewhat nodular contour suggesting changes of cirrhosis. Correlate clinically. 3. The pancreas is obscured by bowel gas. 4. Atrophic appearing left kidney.  No hydronephrosis. 5. Moderate amount of ascites throughout the peritoneal cavity. Electronically Signed   By: Dwyane Dee M.D.   On: 09/25/2017 10:35     CBC Recent Labs  Lab 09/24/17 1734 09/25/17 0550 09/26/17 0444 09/27/17 0853 09/28/17 1024 10/01/17 0440  WBC 7.0 5.6 5.7 5.7 4.8 7.2  HGB 11.6* 9.7* 9.4* 10.3* 9.6* 9.9*  HCT 35.4* 29.7* 28.7* 31.9* 29.9* 31.1*  PLT 221 209 158 173 131* 157  MCV 87.2 87.4 87.2 87.4 87.9 89.4  MCH 28.6 28.5 28.6 28.2 28.2 28.4  MCHC 32.8 32.7 32.8 32.3 32.1 31.8  RDW 16.3* 16.5* 16.7* 16.7* 16.8* 17.8*  LYMPHSABS 1.5  --   --   --   --   --   MONOABS 0.4  --   --   --   --   --   EOSABS 0.2  --   --   --   --   --   BASOSABS 0.1  --   --   --   --   --     Chemistries  Recent Labs  Lab 09/24/17 1734 09/25/17 0550 09/26/17 0444 09/27/17 0853 09/28/17 1024 09/29/17 1056 09/30/17 0527 10/01/17 0440  NA 140 141 139 141 139 139 141 140  K 4.9 4.7 4.6 4.1 3.4* 3.2* 3.6 3.7  CL 107 109 110 111 110 111 114* 115*  CO2 23 23 21* 20* 19* 18* 18* 17*  GLUCOSE 91  81 94 100* 129* 141* 115* 120*  BUN 79* 82* 82* 75* 72* 64* 64* 62*  CREATININE 5.39* 5.35* 5.27* 4.82* 5.01* 4.75* 4.43* 4.23*  CALCIUM 8.7* 8.5* 8.1* 8.3* 8.1* 8.3* 8.3* 8.2*  AST 87* 79* 66*  --   --   --   --  51*  ALT 50 44 37  --   --   --   --  25  ALKPHOS 106 98 91  --   --   --   --  51  BILITOT 1.7* 1.4* 1.6*  --   --   --   --  1.5*   ------------------------------------------------------------------------------------------------------------------ No results for input(s): CHOL, HDL, LDLCALC, TRIG, CHOLHDL, LDLDIRECT in the last 72 hours.  Lab Results  Component Value Date   HGBA1C 4.3 (L) 09/25/2017   ------------------------------------------------------------------------------------------------------------------ No results for input(s): TSH, T4TOTAL, T3FREE, THYROIDAB in the last 72 hours.  Invalid input(s): FREET3 ------------------------------------------------------------------------------------------------------------------ No results for input(s): VITAMINB12, FOLATE, FERRITIN, TIBC, IRON, RETICCTPCT in the last 72 hours.  Coagulation profile Recent Labs  Lab 09/25/17 1239  INR 1.35    No results for input(s): DDIMER in the last 72 hours.  Cardiac Enzymes No results for input(s): CKMB, TROPONINI, MYOGLOBIN in the last 168 hours.  Invalid input(s): CK ------------------------------------------------------------------------------------------------------------------ No results found for: BNP   Shon Hale M.D on 10/01/2017 at 4:48 PM  Between 7am to 7pm - Pager - [phone]  After 7pm go to [email or web address] - password [email or web address]  [facility] -  Office  [phone]   Voice Recognition Reubin Milan dictation system was used to create this note, attempts have been made to correct errors. Please contact the author with questions and/or clarifications.

## 2017-10-02 DIAGNOSIS — R197 Diarrhea, unspecified: Secondary | ICD-10-CM

## 2017-10-02 DIAGNOSIS — K7689 Other specified diseases of liver: Secondary | ICD-10-CM

## 2017-10-02 LAB — COMPREHENSIVE METABOLIC PANEL
ALK PHOS: 51 U/L (ref 38–126)
ALT: 27 U/L (ref 14–54)
AST: 52 U/L — AB (ref 15–41)
Albumin: 2.3 g/dL — ABNORMAL LOW (ref 3.5–5.0)
Anion gap: 9 (ref 5–15)
BILIRUBIN TOTAL: 1.6 mg/dL — AB (ref 0.3–1.2)
BUN: 59 mg/dL — AB (ref 6–20)
CALCIUM: 8.4 mg/dL — AB (ref 8.9–10.3)
CO2: 17 mmol/L — ABNORMAL LOW (ref 22–32)
Chloride: 113 mmol/L — ABNORMAL HIGH (ref 101–111)
Creatinine, Ser: 4.31 mg/dL — ABNORMAL HIGH (ref 0.44–1.00)
GFR calc Af Amer: 12 mL/min — ABNORMAL LOW (ref >60)
GFR, EST NON AFRICAN AMERICAN: 10 mL/min — AB (ref >60)
GLUCOSE: 109 mg/dL — AB (ref 65–99)
Potassium: 3.9 mmol/L (ref 3.5–5.1)
Sodium: 139 mmol/L (ref 135–145)
TOTAL PROTEIN: 5.3 g/dL — AB (ref 6.5–8.1)

## 2017-10-02 LAB — GLUCOSE, CAPILLARY
GLUCOSE-CAPILLARY: 100 mg/dL — AB (ref 65–99)
GLUCOSE-CAPILLARY: 118 mg/dL — AB (ref 65–99)
GLUCOSE-CAPILLARY: 112 mg/dL — AB (ref 65–99)
Glucose-Capillary: 128 mg/dL — ABNORMAL HIGH (ref 65–99)

## 2017-10-02 LAB — CBC
HEMATOCRIT: 34.9 % — AB (ref 36.0–46.0)
Hemoglobin: 11.1 g/dL — ABNORMAL LOW (ref 12.0–15.0)
MCH: 28.6 pg (ref 26.0–34.0)
MCHC: 31.8 g/dL (ref 30.0–36.0)
MCV: 89.9 fL (ref 78.0–100.0)
PLATELETS: 162 10*3/uL (ref 150–400)
RBC: 3.88 MIL/uL (ref 3.87–5.11)
RDW: 18.1 % — ABNORMAL HIGH (ref 11.5–15.5)
WBC: 6.6 10*3/uL (ref 4.0–10.5)

## 2017-10-02 MED ORDER — GLUCERNA SHAKE PO LIQD
237.0000 mL | Freq: Two times a day (BID) | ORAL | Status: DC
  Administered 2017-10-02 – 2017-10-04 (×4): 237 mL via ORAL
  Filled 2017-10-02 (×5): qty 237

## 2017-10-02 MED ORDER — LORAZEPAM 1 MG PO TABS: 1.0000 mg | ORAL_TABLET | Freq: Four times a day (QID) | ORAL | Status: DC | PRN

## 2017-10-02 MED ORDER — OXYCODONE HCL 5 MG PO TABS
5.0000 mg | ORAL_TABLET | ORAL | Status: DC | PRN
Start: 2017-10-02 — End: 2017-10-04
  Administered 2017-10-03: 5 mg via ORAL
  Filled 2017-10-02: qty 1

## 2017-10-02 NOTE — Consult Note (Signed)
Consultation Note Date: 10/02/2017   Patient Name: [name]  DOB: 1960/07/21  MRN: [id number]  Age / Sex: 57 y.o., female  PCP: Patient, No Pcp Per Referring Physician: Roxan Hockey, MD  Reason for Consultation: Establishing goals of care  HPI/Patient Profile: 57 y.o. female  with past medical history of HTN, hypothyroid, CKD admitted on 09/24/2017 with diarrhea for several months, nausea, vomiting. Workup reveals acute on chronic kidney failure, hepatic failure, hepatic encephalopathy- likely hepatorenal syndrome. Patient is not candidate for dialysis given comorbidities of end stage hepatic failure. Palliative medicine consulted for further [place].       Clinical Assessment and Goals of Care:  I have reviewed medical records including EPIC notes, labs and imaging,  assessed the patient and then met at the bedside along with the patient  to discuss diagnosis prognosis, GOC, EOL wishes, disposition and options.  I introduced Palliative Medicine as specialized medical care for people living with serious illness. It focuses on providing relief from the symptoms and stress of a serious illness. The goal is to improve quality of life for both the patient and the family.  We discussed a brief life review of the patient. She is from [place], [place]. She has three sisters. She has been residing at [facility] jail awaiting sentencing for a federal crime. Her family members are unaware of her hospitalization. She has no surviving parents, no children, no spouse.   As far as functional and nutritional status - she has been unable to eat without pain for several months. She has not noticed weight loss- but this is likely due to her having a large amount of third spacing of fluid. Her albumin on admission was 1.9.    We discussed their current illness and what it means in the larger context of their on-going  co-morbidities.  Natural disease trajectory and expectations at EOL were discussed. She is aware per discussion with nephrology that her hepatic and renal failure are end stage. She does not want to discuss prognosis. Her main concern is to get into communication with her family and sister's. She voices concerns about dying in an unfamiliar place. She wants to spend what time she has left with her sisters.   The difference between aggressive medical intervention and comfort care was considered in light of the patient's goals of care.   Advanced directives, concepts specific to code status, artifical feeding and hydration, and rehospitalization were considered. Patient has been previously made DNR status by Dr. Jonnie Finner, code stats was not discussed. She wants to delay further conversation until her sisters can be present.   Questions and concerns were addressed. The family was encouraged to call with questions or concerns.   Primary Decision Maker PATIENT    SUMMARY OF RECOMMENDATIONS -Oxycodone 84m IR po q4hr prn pain -Lorazepam 147mpo q6hr prn anxiety -? IR for palliative paracentesis will defer to primary team to order- ultrasound showed moderate ascites- this may be helpful to relieve some pain -Have contacted [phone]  to discuss Hospice/Compassionate Release in light of patient's terminal diagnosis- they are considering releasing patient to her sister's care, once permission to communicate with her family has been granted will further discuss disposition and [place]  Code Status/Advance Care Planning:  DNR  Palliative Prophylaxis:   Frequent Pain Assessment  Additional Recommendations (Limitations, Scope, Preferences):  Full Scope Treatment for now- will discuss more when family support is present  Prognosis:    < 6 months d/t end stage hepatorenal failure aeb hepatorenal failure, hepatic encephalopathy,  Serum Albumin < 2.5 (1.9),  PT>5 (16.5),    Discharge Planning: To Be Determined  Primary Diagnoses: Present on Admission: . ARF (acute renal failure) ([place]) . Diarrhea . Anemia . Tachycardia   I have reviewed the medical record, interviewed the patient and family, and examined the patient. The following aspects are pertinent.  Past Medical History:  Diagnosis Date  . CKD (chronic kidney disease)   . Hypertension   . Type II diabetes mellitus with complication [place])    Social History   Socioeconomic History  . Marital status: Divorced    Spouse name: None  . Number of children: None  . Years of education: None  . Highest education level: None  Social Needs  . Financial resource strain: None  . Food insecurity - worry: None  . Food insecurity - inability: None  . Transportation needs - medical: None  . Transportation needs - non-medical: None  Occupational History  . None  Tobacco Use  . Smoking status: Never Smoker  . Smokeless tobacco: Never Used  Substance and Sexual Activity  . Alcohol use: No    Frequency: Never  . Drug use: None  . Sexual activity: None  Other Topics Concern  . None  Social History Narrative  . None   Family History  Problem Relation Age of Onset  . Heart attack Father    Scheduled Meds: . feeding supplement (GLUCERNA SHAKE)  237 mL Oral BID BM  . FLUoxetine  40 mg Oral Daily  . heparin  5,000 Units Subcutaneous Q8H  . insulin aspart  0-5 Units Subcutaneous QHS  . insulin aspart  0-9 Units Subcutaneous TID WC  . lactulose  20 g Oral Daily  . levothyroxine  125 mcg Oral QAC breakfast  . midodrine  10 mg Oral TID WC  . potassium chloride  40 mEq Oral Once  . potassium chloride  40 mEq Oral Once  . rifaximin  550 mg Oral BID   Continuous Infusions: . octreotide  (SANDOSTATIN)    IV infusion 50 mcg/hr (10/02/17 0347)   PRN Meds:.acetaminophen **OR** acetaminophen, ondansetron (ZOFRAN) IV Medications Prior to Admission:  Prior to Admission medications     Medication Sig Start Date End Date Taking? Authorizing Provider  FLUoxetine (PROZAC) 20 MG capsule Take 40 mg by mouth daily.   Yes [provider]  levothyroxine (SYNTHROID, LEVOTHROID) 100 MCG tablet Take 100 mcg by mouth daily before breakfast.   Yes [provider]  Multiple Vitamins-Minerals (MULTIVITAMIN ADULT PO) Take 1 tablet by mouth daily.   Yes [provider]   No Known Allergies Review of Systems  All other systems reviewed and are negative.   Physical Exam  Constitutional: She is oriented to person, place, and time.  Cardiovascular: Normal rate and regular rhythm.  Diffuse anasarca  Abdominal: Bowel sounds are normal. She exhibits fluid wave.  ascites  Neurological: She is alert and oriented to person, place, and time.  Skin: Skin is warm.  Psychiatric:  Depressed affect, tearful  Nursing note and vitals reviewed.   Vital Signs: BP 109/77 (BP Location: Left Arm)   Pulse 64   Temp 97.7 F (36.5 C)   Resp 18   Ht 5' 8"  (1.727 m)   Wt 118.8 kg (262 lb)   SpO2 100%   BMI 39.84 kg/m  Pain Assessment: No/denies pain POSS *See Group Information*: 1-Acceptable,Awake and alert Pain Score: 4    SpO2: SpO2: 100 % O2 Device:SpO2: 100 % O2 Flow Rate: .   IO: Intake/output summary:   Intake/Output Summary (Last 24 hours) at 10/02/2017 1343 Last data filed at 10/02/2017 4097 Gross per 24 hour  Intake -  Output 0 ml  Net 0 ml    LBM: Last BM Date: 10/01/17 Baseline Weight: Weight: 108.9 kg (240 lb) Most recent weight: Weight: 118.8 kg (262 lb)     Palliative Assessment/Data: PPS: 20%     Thank you for this consult. Palliative medicine will continue to follow and assist as needed.   Time In: 1230 Time Out: 1415 Time Total: 105 minutes Prolong services billed: Yes Greater than 50%  of this time was spent counseling and coordinating care related to the above assessment and plan.  Signed by: Mariana Kaufman, AGNP-C Palliative  Medicine    Please contact Palliative Medicine Team phone at [phone] for questions and concerns.  For individual provider: See Shea Evans

## 2017-10-02 NOTE — Progress Notes (Signed)
Patient with no urine output since Foley removal. Two nurses performed bladder scan with no urine detected in bladder. MD notified, no new orders given. Will continue to monitor.

## 2017-10-02 NOTE — Plan of Care (Signed)
  Progressing Education: Knowledge of General Education information will improve 10/02/2017 1350 - Progressing by Urban Naval, Alben SpittleMary E, RN Coping: Level of anxiety will decrease 10/02/2017 1350 - Progressing by Quiera Diffee, Alben SpittleMary E, RN Safety: Ability to remain free from injury will improve 10/02/2017 1350 - Progressing by Deyona Soza, Alben SpittleMary E, RN

## 2017-10-02 NOTE — Progress Notes (Signed)
Admit: 09/24/2017 LOS: 8  27F with renal failure, appears AoCKD3 (SCr was 2s in 02/2017), AMS, suspected cirrhosis.  Found to have L renal atrophy unknown chronicity but suspect not new  Subjective:  No new c/o's, creat up today to 4.3.  UOp remains poor at 300 cc/ day. Pt w/ some nausea, no SOB, lying flat    No intake/output data recorded.  Filed Weights   09/30/17 0529 10/01/17 0447 10/02/17 0638  Weight: 121.6 kg (268 lb 1.3 oz) 121.3 kg (267 lb 6.7 oz) 118.8 kg (262 lb)    Scheduled Meds: . feeding supplement (GLUCERNA SHAKE)  237 mL Oral Q24H  . FLUoxetine  40 mg Oral Daily  . heparin  5,000 Units Subcutaneous Q8H  . insulin aspart  0-5 Units Subcutaneous QHS  . insulin aspart  0-9 Units Subcutaneous TID WC  . lactulose  20 g Oral Daily  . levothyroxine  125 mcg Oral QAC breakfast  . midodrine  10 mg Oral TID WC  . potassium chloride  40 mEq Oral Once  . potassium chloride  40 mEq Oral Once  . rifaximin  550 mg Oral BID   Continuous Infusions: . octreotide  (SANDOSTATIN)    IV infusion 50 mcg/hr (10/02/17 0347)   PRN Meds:.acetaminophen **OR** acetaminophen, ondansetron (ZOFRAN) IV  Current Labs: reviewed    Ref. Range 09/25/2017 16:00  Protein Creatinine Ratio Latest Ref Range: 0.00 - 0.15 mg/mgCre 0.15    Ref. Range 09/24/2017 19:01  Sodium, Urine Latest Units: mmol/L <10    Physical Exam:  Blood pressure 122/89, pulse 75, temperature 97.6 F (36.4 C), temperature source Oral, resp. rate 18, height 5\' 8"  (1.727 m), weight 118.8 kg (262 lb), SpO2 97 %. GEN: No acute distress, awake, chronically ill appearing ENT: Poor dentition, NCAT EYES: EOMI CV: RRR, no rub, no murmur PULM: Diminished breath sounds in the bases, normal respiratory effort ABD: Obese, soft, nontender, no hepatosplenomegaly SKIN: No rashes or lesions EXT: bilat anasarca, 3+ edema bilat LE's NEURO: Asterixis very minimal today  UNa < 10, U Creat 248   Assessment: 1. AoCKD3 vs CKD  progression: UA neg, US shows atrophic L kidney. Baseline creat 1.8 reportedly end of last year.  Clinical picture most c/w hepatorenal syndrome  (borderline hypotension, low UNa, oliguric, and significant anasarca/ ascites in setting of known cirrhotic liver disease). D#6 of medical Rx , not really responding, creat was 5's on admission and today is mid 4's.  Very poor prognosis, have consulted palliative care.  Not a dialysis candidate given comorbidities. All explained to patient including poor prognosis.  DNR recommended , and she agrees to DNR.  Nurse was in the room for DNR corroboration.  2. AMS - hepatic encephalopathy, better on lactulose/ rifaximin 3. CKD / L renal atrophy - by US 4. Cirrhosis, advanced 5. DM2 but A1c is normal 6. HTN, normal BPs no meds  P - as above   Vinson Moselleob Lanyah Spengler MD [facility] pgr [phone]   10/02/2017, 9:37 AM  Recent Labs  Lab 09/27/17 0853  09/30/17 0527 10/01/17 0440 10/02/17 0442  NA 141   < > 141 140 139  K 4.1   < > 3.6 3.7 3.9  CL 111   < > 114* 115* 113*  CO2 20*   < > 18* 17* 17*  GLUCOSE 100*   < > 115* 120* 109*  BUN 75*   < > 64* 62* 59*  CREATININE 4.82*   < > 4.43* 4.23* 4.31*  CALCIUM 8.3*   < > 8.3* 8.2* 8.4*  PHOS 4.0  --   --   --   --    < > = values in this interval not displayed.   Recent Labs  Lab 09/28/17 1024 10/01/17 0440 10/02/17 0442  WBC 4.8 7.2 6.6  HGB 9.6* 9.9* 11.1*  HCT 29.9* 31.1* 34.9*  MCV 87.9 89.4 89.9  PLT 131* 157 162

## 2017-10-02 NOTE — Progress Notes (Signed)
Patient Demographics:    [name], is a 57 y.o. female, DOB - January 03, 1961, WUJ:[id number]  Admit date - 09/24/2017   Admitting Physician Pearson Grippe, MD  Outpatient Primary MD for the patient is Patient, No Pcp Per  LOS - 8   Chief Complaint  Patient presents with  . Facial Swelling    right eye  . Abdominal Pain        Subjective:    [name] today has no fevers, no emesis,  No chest pain,  Prison guard at bedside , UOP about , eating ok ,  Pt is tearful when asked about code status  Assessment  & Plan :    Principal Problem:   ARF (acute renal failure) (HCC) Active Problems:   Diarrhea   Anemia   Tachycardia   Abnormal liver function  Brief Summary:- 57 y/o female,w hypertension, hypothyroidism, ckd (unclear stage, no baseline creatinine), apparently presents w c/ochronic diarrhea for months as well as n/v a few days prior and some bilateral lower abdominal discomfort.  Admitted from [facility] on 09/24/2017 with concerns about possible Hepatic Encephalopathy and Renal Failure (suspect hepatorenal syndrome)   Assessment/Plan:  1)AKI on ckd (unknown stage)-UOP is scant , [facility] suggest creatinine was 1.8 in August 2018, suspect hepatorenal syndrome , creatinine is still above 4,  creatinine had peaked at 5.39, hypoalbuminemia (uncelar if from cirrhosis or nephropathy). C/n to  Monitor I/o, urine output, renal labs.  Nephrology consult appreciated, renal ultrasound without obstructive uropathy, UA without proteinuria, urine sodium is low, completed IV albumin on 09/29/17,, maintain adequate hydration, was on midodrine/octreotide per nephrology for HepatoRenal Syndrome.   2)DM- Reports taking metformin in the past.  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 Ammonia had peaked at 143,  c/n Lactulose once daily (goal is 2 to 3 BMs /day), ,  Abdominal ultrasound with asymptomatic Cholelithiasis, mild to moderate ascites,. No s/s of cholecystitis, c/n lactulose and rifaximin.    4)Hypothyroidism, tsh-6.2.   levothyroxine  Was increased to .   5)Anemia-suspect some degree of chronic anemia due to CKD, monitor H&H and transfuse as clinically indicated  6)Diarrhea- Much improved,  c/n Lactulose once daily (goal is 2 to 3 BMs /day), patient apparently had diarrhea prior to admission, diarrhea got worse on lactulose here in the hospital, on 09/26/17 .Marland Kitchen..C. difficile antigen was positive, C. difficile toxin was negative and C. difficile PCR was negative.  Clinically doubt that patient has active C. difficile infection.  She is on rifaximin for hepatic encephalopathy which is not really a first-line treatment for C. difficile per se.   7)Hypotension-continue midodrine/octreotide per nephrology for hepatorenal syndrome  8)Social/Ethics-patient is a DNR, and overall prognosis is very poor given hepatorenal syndrome, liver failure renal failure.    Palliative care consult requested to Help determine/delineate Goals of care appreciated, Patient has hepatorenal syndrome with poor prognosis - contacted Kerin [facility] Supervisor- Harriette [phone]- to discuss Hospice/Compassionate Release in light of patient's terminal diagnosis- they are considering releasing patient to her sister's care, once permission to communicate with her family has been granted will further discuss  disposition and GOC  9)Generalized weakness/debility-physical therapy evaluation appreciated, recommend rolling walker  10)Disposition-[facility] [employer] [facility] the custody of [employer], patient has hepatorenal syndrome with poor prognosis - contacted [employer] Supervisor- Harriette Bouillonhomas [phone]- [phone] to discuss  Hospice/Compassionate Release in light of patient's terminal diagnosis- they are considering releasing patient to her sister's care, once permission to communicate with her family has been granted will further discuss disposition and GOC  Code Status:  DNR Family Communication: d/w patient,  DVT Prophylaxis  : Heparin  Lab Results  Component Value Date   PLT 162 10/02/2017   Inpatient Medications  Scheduled Meds: . feeding supplement (GLUCERNA SHAKE)  237 mL Oral BID BM  . FLUoxetine  40 mg Oral Daily  . heparin  5,000 Units Subcutaneous Q8H  . insulin aspart  0-5 Units Subcutaneous QHS  . insulin aspart  0-9 Units Subcutaneous TID WC  . lactulose  20 g Oral Daily  . levothyroxine  125 mcg Oral QAC breakfast  . midodrine  10 mg Oral TID WC  . potassium chloride  40 mEq Oral Once  . potassium chloride  40 mEq Oral Once  . rifaximin  550 mg Oral BID   Continuous Infusions: . octreotide  (SANDOSTATIN)    IV infusion 50 mcg/hr (10/02/17 0347)   PRN Meds:.acetaminophen **OR** acetaminophen, LORazepam, ondansetron (ZOFRAN) IV, oxyCODONE   Anti-infectives (From admission, onward)   Start     Dose/Rate Route Frequency Ordered Stop   09/28/17 1815  vancomycin (VANCOCIN) 50 mg/mL oral solution 125 mg  Status:  Discontinued     125 mg Oral Every 6 hours 09/28/17 1814 09/28/17 1815   09/25/17 2200  rifaximin (XIFAXAN) tablet 550 mg     550 mg Oral 2 times daily 09/25/17 1739          Objective:   Vitals:   10/01/17 2224 10/02/17 0638 10/02/17 1318 10/02/17 1724  BP: 104/70 122/89 109/77 107/76  Pulse: 71 75 64 67  Resp: 18 18 18 18   Temp: 98.1 F (36.7 C) 97.6 F (36.4 C) 97.7 F (36.5 C) (!) 97.5 F (36.4 C)  TempSrc: Oral Oral  Oral  SpO2: 98% 97% 100% 100%  Weight:  118.8 kg (262 lb)    Height:        Wt Readings from Last 3 Encounters:  10/02/17 118.8 kg (262 lb)    Intake/Output Summary (Last 24 hours) at 10/02/2017 2019 Last data filed at 10/02/2017  1832 Gross per 24 hour  Intake -  Output 300 ml  Net -300 ml   Physical Exam  Gen:- Awake Alert,  In no apparent distress , obese HEENT:- [place].AT, No sclera icterus Neck-Supple Neck,No JVD,.  Lungs-  CTAB , no wheezing CV- S1, S2 normal Abd-  +ve B.Sounds, Abd Soft, No significant tenderness, increased truncal adiposity Extremity/Skin:- No  edema,  Skin is warm Psych- oriented x3 interactive/talkative, patient is tearful at times Neuro-no new focal deficits, no tremors GU- foley with very scant amount of urine    Data Review:   Micro Results Recent Results (from the past 240 hour(s))  MRSA PCR Screening     Status: None   Collection Time: 09/24/17 10:13 PM  Result Value Ref Range Status   MRSA by PCR NEGATIVE NEGATIVE Final    Comment:        The GeneXpert MRSA Assay (FDA approved for NASAL specimens only), is one component of a comprehensive MRSA colonization surveillance program. It is not intended  to diagnose MRSA infection nor to guide or monitor treatment for MRSA infections. Performed at [facility], [address], [place], [place] [address]   C difficile quick scan w PCR reflex     Status: Abnormal   Collection Time: 09/26/17  2:42 AM  Result Value Ref Range Status   C Diff antigen POSITIVE (A) NEGATIVE Final   C Diff toxin NEGATIVE NEGATIVE Final   C Diff interpretation Results are indeterminate. See PCR results.  Final    Comment: Performed at [facility], [address] [place], [place] [address]  C. Diff by PCR, Reflexed     Status: None   Collection Time: 09/26/17  2:42 AM  Result Value Ref Range Status   Toxigenic C. Difficile by PCR NEGATIVE NEGATIVE Final    Comment: Patient is colonized with non toxigenic C. difficile. May not need treatment unless significant symptoms are present. Performed at [facility], [address], [place], [place] [address]     Radiology Reports US Abdomen  Complete  Result Date: 09/25/2017 CLINICAL DATA:  Abnormal liver function tests, hypertension, diabetes EXAM: ABDOMEN ULTRASOUND COMPLETE COMPARISON:  None. FINDINGS: Gallbladder: There are gallstones present, the largest of 2.4 cm in diameter with acoustical shadowing. There is no pain over the gallbladder with compression. The gallbladder wall is somewhat prominent measuring 4.5 mm. No pericholecystic fluid is seen. Common bile duct: Diameter: The common bile duct is normal measuring 5.2 mm in diameter. Liver: The liver is small and echogenic consistent with fatty infiltration. The contours are somewhat irregular which may indicate changes of cirrhosis. Correlate clinically. Portal vein is patent on color Doppler imaging with normal direction of blood flow towards the liver. IVC: No abnormality visualized. Pancreas: The pancreas is obscured by bowel gas. Spleen: The spleen measures 8.9 cm. Right Kidney: Length: 9.3 cm.  No hydronephrosis is seen. Left Kidney: Length: 6.4 cm. The left kidney appears atrophic. No hydronephrosis is noted. Abdominal aorta: The abdominal aorta is normal in caliber. Other findings: There is a moderate amount of ascites throughout the peritoneal cavity. IMPRESSION: 1. Gallstones, the largest of 2.4 cm. No pain is present over the gallbladder currently with compression. 2. Echogenic liver parenchyma with the liver being small with somewhat nodular contour suggesting changes of cirrhosis. Correlate clinically. 3. The pancreas is obscured by bowel gas. 4. Atrophic appearing left kidney.  No hydronephrosis. 5. Moderate amount of ascites throughout the peritoneal cavity. Electronically Signed   By: Dwyane Dee M.D.   On: 09/25/2017 10:35     CBC Recent Labs  Lab 09/26/17 0444 09/27/17 0853 09/28/17 1024 10/01/17 0440 10/02/17 0442  WBC 5.7 5.7 4.8 7.2 6.6  HGB 9.4* 10.3* 9.6* 9.9* 11.1*  HCT 28.7* 31.9* 29.9* 31.1* 34.9*  PLT 158 173 131* 157 162  MCV 87.2 87.4 87.9 89.4 89.9   MCH 28.6 28.2 28.2 28.4 28.6  MCHC 32.8 32.3 32.1 31.8 31.8  RDW 16.7* 16.7* 16.8* 17.8* 18.1*    Chemistries  Recent Labs  Lab 09/26/17 0444  09/28/17 1024 09/29/17 1056 09/30/17 0527 10/01/17 0440 10/02/17 0442  NA 139   < > 139 139 141 140 139  K 4.6   < > 3.4* 3.2* 3.6 3.7 3.9  CL 110   < > 110 111 114* 115* 113*  CO2 21*   < > 19* 18* 18* 17* 17*  GLUCOSE 94   < > 129* 141* 115* 120* 109*  BUN 82*   < >  72* 64* 64* 62* 59*  CREATININE 5.27*   < > 5.01* 4.75* 4.43* 4.23* 4.31*  CALCIUM 8.1*   < > 8.1* 8.3* 8.3* 8.2* 8.4*  AST 66*  --   --   --   --  51* 52*  ALT 37  --   --   --   --  25 27  ALKPHOS 91  --   --   --   --  51 51  BILITOT 1.6*  --   --   --   --  1.5* 1.6*   < > = values in this interval not displayed.   ------------------------------------------------------------------------------------------------------------------ No results for input(s): CHOL, HDL, LDLCALC, TRIG, CHOLHDL, LDLDIRECT in the last 72 hours.  Lab Results  Component Value Date   HGBA1C 4.3 (L) 09/25/2017   ------------------------------------------------------------------------------------------------------------------ No results for input(s): TSH, T4TOTAL, T3FREE, THYROIDAB in the last 72 hours.  Invalid input(s): FREET3 ------------------------------------------------------------------------------------------------------------------ No results for input(s): VITAMINB12, FOLATE, FERRITIN, TIBC, IRON, RETICCTPCT in the last 72 hours.  Coagulation profile No results for input(s): INR, PROTIME in the last 168 hours.  No results for input(s): DDIMER in the last 72 hours.  Cardiac Enzymes No results for input(s): CKMB, TROPONINI, MYOGLOBIN in the last 168 hours.  Invalid input(s): CK ------------------------------------------------------------------------------------------------------------------ No results found for: BNP   Shon Hale M.D on 10/02/2017 at 8:19 PM  Between 7am  to 7pm - Pager - [phone]  After 7pm go to [email or web address] - password [email or web address]  [facility] -  Office  [phone]  Voice Recognition Reubin Milan dictation system was used to create this note, attempts have been made to correct errors. Please contact the author with questions and/or clarifications.

## 2017-10-02 NOTE — Progress Notes (Signed)
Nutrition Follow-up  DOCUMENTATION CODES:   Obesity unspecified  INTERVENTION:   Increase Glucerna Shake po to BID, each supplement provides 220 kcal and 10 grams of protein  NUTRITION DIAGNOSIS:   Inadequate oral intake related to vomiting, nausea as evidenced by per patient/family report.  Ongoing  GOAL:   Patient will meet greater than or equal to 90% of their needs  Not meeting  MONITOR:   PO intake, Supplement acceptance, Weight trends, Labs  REASON FOR ASSESSMENT:   Malnutrition Screening Tool    ASSESSMENT:   Patient with PMH significant for HTN and CKD (unclear of stage). Presents this admission with chronic diarrhea and acute renal failure.   Pt's appetite continues to be poor. Meal completions charted as 0% for the last four days. Pt states she has not eaten much due abdominal pain upon eating. RD observed breakfast at bedside with nothing consumed. The guards in her room, state she will drink all of her beverages including her Glucerna. Pt does not experience abdominal pain with drinking. Will increase Glucerna to BID in attempts to maximize protein and calories within her fluid restriction.   Weight noted to increase by 15 lb since last RD visit 3/12 (247 lb to 262 lb). Will continue to monitor trends.   Medications reviewed and include: SSI, lactulose, 40 mEq KCl, IV abx Labs reviewed: BUN 59 (H) Creatinine 4.31 (H) AST 52 (H)   Diet Order:  Diet renal/carb modified with fluid restriction Diet-HS Snack? Nothing; Fluid restriction: 1200 mL Fluid; Room service appropriate? Yes; Fluid consistency: Thin  EDUCATION NEEDS:   Not appropriate for education at this time  Skin:  Skin Assessment: Reviewed RN Assessment  Last BM:  10/01/17  Height:   Ht Readings from Last 1 Encounters:  09/24/17 5\' 8"  (1.727 m)    Weight:   Wt Readings from Last 1 Encounters:  10/02/17 262 lb (118.8 kg)    Ideal Body Weight:  63.6 kg  BMI:  Body mass index is 39.84  kg/m.  Estimated Nutritional Needs:   Kcal:  1600-1800 kcal/day  Protein:  80-90 g/day  Fluid:  Fluid Restriction 1200 ml/day    Vanessa Kickarly Martisha Toulouse RD, LDN Clinical Nutrition Pager # - [phone]

## 2017-10-03 ENCOUNTER — Encounter (HOSPITAL_COMMUNITY): Payer: Self-pay | Admitting: Radiology

## 2017-10-03 ENCOUNTER — Inpatient Hospital Stay (HOSPITAL_COMMUNITY)

## 2017-10-03 DIAGNOSIS — N19 Unspecified kidney failure: Secondary | ICD-10-CM

## 2017-10-03 DIAGNOSIS — K7290 Hepatic failure, unspecified without coma: Secondary | ICD-10-CM

## 2017-10-03 DIAGNOSIS — Z515 Encounter for palliative care: Secondary | ICD-10-CM

## 2017-10-03 LAB — BASIC METABOLIC PANEL
ANION GAP: 8 (ref 5–15)
BUN: 60 mg/dL — ABNORMAL HIGH (ref 6–20)
CALCIUM: 8.3 mg/dL — AB (ref 8.9–10.3)
CO2: 18 mmol/L — AB (ref 22–32)
Chloride: 113 mmol/L — ABNORMAL HIGH (ref 101–111)
Creatinine, Ser: 4.14 mg/dL — ABNORMAL HIGH (ref 0.44–1.00)
GFR, EST AFRICAN AMERICAN: 13 mL/min — AB (ref >60)
GFR, EST NON AFRICAN AMERICAN: 11 mL/min — AB (ref >60)
Glucose, Bld: 137 mg/dL — ABNORMAL HIGH (ref 65–99)
Potassium: 3.5 mmol/L (ref 3.5–5.1)
Sodium: 139 mmol/L (ref 135–145)

## 2017-10-03 LAB — GLUCOSE, CAPILLARY
GLUCOSE-CAPILLARY: 139 mg/dL — AB (ref 65–99)
GLUCOSE-CAPILLARY: 119 mg/dL — AB (ref 65–99)
Glucose-Capillary: 126 mg/dL — ABNORMAL HIGH (ref 65–99)
Glucose-Capillary: 102 mg/dL — ABNORMAL HIGH (ref 65–99)

## 2017-10-03 LAB — BODY FLUID CELL COUNT WITH DIFFERENTIAL
Lymphs, Fluid: 19 %
Monocyte-Macrophage-Serous Fluid: 74 % (ref 50–90)
Neutrophil Count, Fluid: 7 % (ref 0–25)
Total Nucleated Cell Count, Fluid: 114 cu mm (ref 0–1000)

## 2017-10-03 MED ORDER — ALBUMIN HUMAN 25 % IV SOLN
25.0000 g | Freq: Once | INTRAVENOUS | Status: AC
  Administered 2017-10-03: 25 g via INTRAVENOUS
  Filled 2017-10-03: qty 100

## 2017-10-03 MED ORDER — LIDOCAINE HCL 1 % IJ SOLN
INTRAMUSCULAR | Status: AC
  Filled 2017-10-03: qty 20

## 2017-10-03 MED ORDER — MIDODRINE HCL 5 MG PO TABS
5.0000 mg | ORAL_TABLET | Freq: Three times a day (TID) | ORAL | Status: DC
  Filled 2017-10-03: qty 1

## 2017-10-03 NOTE — Progress Notes (Signed)
Spoke with pt concerning discharge plans. Pt will go live with sister in [place]. [name] [phone]. Other contact sister is [name] [phone]. Pt states her MD is Patricia AsalPatricia Osborne, NP, [facility] [phone], fax [phone] in [place], [place] [id number]. Pt is aware that H&P and Discharge Summary are faxed to her PCP office. Spoke with both sister's today, both will be here in the am. Explained to pt that she will need to go to her PCP for them to write HH orders and refer to Hospice related to this being [facility]. She will need her PCP to follow up with her. Pt agreed and understood.

## 2017-10-03 NOTE — Progress Notes (Signed)
Admit: 09/24/2017 LOS: 9  71F with renal failure, appears AoCKD3 (SCr was 2s in 02/2017), AMS, suspected cirrhosis.  Found to have L renal atrophy unknown chronicity but suspect not new  Subjective:  No new c/o's, family can't come until tomorrow, then the likely plan is home with hospice per pall care notes   03/19 0701 - 03/20 0700 In: 2230.8 [P.O.:210; I.V.:2020.8] Out: 300 [Urine:300]  Filed Weights   10/01/17 0447 10/02/17 0638 10/03/17 0356  Weight: 121.3 kg (267 lb 6.7 oz) 118.8 kg (262 lb) 122 kg (268 lb 15.4 oz)    Scheduled Meds: . feeding supplement (GLUCERNA SHAKE)  237 mL Oral BID BM  . FLUoxetine  40 mg Oral Daily  . heparin  5,000 Units Subcutaneous Q8H  . insulin aspart  0-5 Units Subcutaneous QHS  . insulin aspart  0-9 Units Subcutaneous TID WC  . lactulose  20 g Oral Daily  . levothyroxine  125 mcg Oral QAC breakfast  . midodrine  5 mg Oral TID WC  . potassium chloride  40 mEq Oral Once  . potassium chloride  40 mEq Oral Once  . rifaximin  550 mg Oral BID   Continuous Infusions: . albumin human     PRN Meds:.acetaminophen **OR** acetaminophen, LORazepam, ondansetron (ZOFRAN) IV, oxyCODONE  Current Labs: reviewed    Ref. Range 09/25/2017 16:00  Protein Creatinine Ratio Latest Ref Range: 0.00 - 0.15 mg/mgCre 0.15    Ref. Range 09/24/2017 19:01  Sodium, Urine Latest Units: mmol/L <10    Physical Exam:  Blood pressure 130/80, pulse 84, temperature (!) 97.5 F (36.4 C), temperature source Oral, resp. rate 18, height 5\' 8"  (1.727 m), weight 122 kg (268 lb 15.4 oz), SpO2 100 %. GEN: No acute distress, awake, chronically ill appearing ENT: Poor dentition, NCAT EYES: EOMI CV: RRR, no rub, no murmur PULM: Diminished breath sounds in the bases, normal respiratory effort ABD: Obese, soft, nontender, no hepatosplenomegaly SKIN: No rashes or lesions EXT: bilat anasarca, 3+ edema bilat LE's NEURO: Asterixis very minimal today  UNa < 10, U Creat  248   Assessment: 1. AoCKD3 vs CKD progression: baseline creat 1.8 from late last year reportedly.  Acute component is hepatorenal syndrome, not responding to 7 days Rx. Unlikely to recover at this point.  Will dc octreotide and midodrine. Prognosis very poor, appreciate palliative care assistance. Will follow from a distance.   2. AMS - hepatic encephalopathy, better on lactulose/ rifaximin 3. CKD / L renal atrophy - by US 4. Cirrhosis, advanced 5. DM2 but A1c is normal  P - as above   Vinson Moselleob Daman Steffenhagen MD [facility] pgr [phone]   10/03/2017, 2:12 PM  Recent Labs  Lab 09/27/17 0853  10/01/17 0440 10/02/17 0442 10/03/17 0907  NA 141   < > 140 139 139  K 4.1   < > 3.7 3.9 3.5  CL 111   < > 115* 113* 113*  CO2 20*   < > 17* 17* 18*  GLUCOSE 100*   < > 120* 109* 137*  BUN 75*   < > 62* 59* 60*  CREATININE 4.82*   < > 4.23* 4.31* 4.14*  CALCIUM 8.3*   < > 8.2* 8.4* 8.3*  PHOS 4.0  --   --   --   --    < > = values in this interval not displayed.   Recent Labs  Lab 09/28/17 1024 10/01/17 0440 10/02/17 0442  WBC 4.8 7.2 6.6  HGB 9.6* 9.9*  11.1*  HCT 29.9* 31.1* 34.9*  MCV 87.9 89.4 89.9  PLT 131* 157 162

## 2017-10-03 NOTE — Progress Notes (Signed)
Admitting called related to [name] [facility] is no longer responsible for hospital bill. Letter of Termination of [facility] financial Responsibility for [employer] Medical Costs, in chart and sent to Admitting.

## 2017-10-03 NOTE — Progress Notes (Signed)
PROGRESS NOTE  Dwyane LuoKathy Hino UXL:[id number] DOB: Jan 20, 1961 DOA: 09/24/2017 PCP: Patient, No Pcp Per  HPI/Recap of past 24 hours:  She is aaox3, she reports intermittent ab pain, currently no pain No fever, no n/v. She is edematous Urine output is not accurately documented  Assessment/Plan: Principal Problem:   Renal failure Active Problems:   Diarrhea   Anemia   Tachycardia   Abnormal liver function   Liver failure without hepatic coma (HCC)   Palliative care by specialist  Cirrhosis , hepatic encephalopathy , ascites ,hepatorenal syndrome, AKI -[facility] was [facility] from [facility] to hospital due to increased swelling -Is found to be in AK I, BUN 82 creatinine 5.27 on presentation, she has elevated ammonia level 143 And confusion -Nephrology consulted on presentation, started on octreotide drip admitted abdomen for treatment of hepatorenal syndrome, however she has not improved after 7-8 days treatment.  Octreotide drip and amiodarone stopped today. neurology recommended palliative care consult -She received therapeutic paracentesis today as well with 3 L of clear fluid removed -She is continued on Xifaxan -Likely will discharge home on home hospice after family meeting with palliative care team  Hypothyroidism continue Synthroid for now  Code Status: DNR  Family Communication: patient   Disposition Plan: home with home hospice , hopefully tomorrow on 3/21   Consultants:  Nephrology  Palliative care  Procedures:  [place] guided paracentesis on 3/20  Antibiotics:  none   Objective: BP 130/80 (BP Location: Right Arm)   Pulse 84   Temp (!) 97.5 F (36.4 C) (Oral)   Resp 18   Ht 5\' 8"  (1.727 m)   Wt 122 kg (268 lb 15.4 oz)   LMP  (Exact Date)   SpO2 100%   BMI 40.90 kg/m    Intake/Output Summary (Last 24 hours) at 10/03/2017 1411 Last data filed at 10/03/2017 0437 Gross per 24 hour  Intake 2230.83 ml  Output 300 ml  Net 1930.83 ml   Filed Weights   10/01/17 0447  10/02/17 0638 10/03/17 0356  Weight: 121.3 kg (267 lb 6.7 oz) 118.8 kg (262 lb) 122 kg (268 lb 15.4 oz)    Exam: Patient is examined daily including today on 10/03/2017, exams remain the same as of yesterday except that has changed    General:  Frail, weak, chronically ill appearing, NAD, aaox3, generalized edema  Cardiovascular: RRR  Respiratory: diminished at basis  Abdomen: distended, fluids waves, nontender, no guarding,  positive BS  Musculoskeletal:  Edematous  Neuro: alert, oriented   Data Reviewed: Basic Metabolic Panel: Recent Labs  Lab 09/27/17 0853  09/29/17 1056 09/30/17 0527 10/01/17 0440 10/02/17 0442 10/03/17 0907  NA 141   < > 139 141 140 139 139  K 4.1   < > 3.2* 3.6 3.7 3.9 3.5  CL 111   < > 111 114* 115* 113* 113*  CO2 20*   < > 18* 18* 17* 17* 18*  GLUCOSE 100*   < > 141* 115* 120* 109* 137*  BUN 75*   < > 64* 64* 62* 59* 60*  CREATININE 4.82*   < > 4.75* 4.43* 4.23* 4.31* 4.14*  CALCIUM 8.3*   < > 8.3* 8.3* 8.2* 8.4* 8.3*  PHOS 4.0  --   --   --   --   --   --    < > = values in this interval not displayed.   Liver Function Tests: Recent Labs  Lab 09/27/17 0853 10/01/17 0440 10/02/17 0442  AST  --  51* 52*  ALT  --  25 27  ALKPHOS  --  51 51  BILITOT  --  1.5* 1.6*  PROT  --  5.2* 5.3*  ALBUMIN 1.8* 2.4* 2.3*   No results for input(s): LIPASE, AMYLASE in the last 168 hours. No results for input(s): AMMONIA in the last 168 hours. CBC: Recent Labs  Lab 09/27/17 0853 09/28/17 1024 10/01/17 0440 10/02/17 0442  WBC 5.7 4.8 7.2 6.6  HGB 10.3* 9.6* 9.9* 11.1*  HCT 31.9* 29.9* 31.1* 34.9*  MCV 87.4 87.9 89.4 89.9  PLT 173 131* 157 162   Cardiac Enzymes:   No results for input(s): CKTOTAL, CKMB, CKMBINDEX, TROPONINI in the last 168 hours. BNP (last 3 results) No results for input(s): BNP in the last 8760 hours.  ProBNP (last 3 results) No results for input(s): PROBNP in the last 8760 hours.  CBG: Recent Labs  Lab 10/02/17 1159  10/02/17 1658 10/02/17 2156 10/03/17 0742 10/03/17 1211  GLUCAP 118* 112* 128* 139* 126*    Recent Results (from the past 240 hour(s))  MRSA PCR Screening     Status: None   Collection Time: 09/24/17 10:13 PM  Result Value Ref Range Status   MRSA by PCR NEGATIVE NEGATIVE Final    Comment:        The GeneXpert MRSA Assay (FDA approved for NASAL specimens only), is one component of a comprehensive MRSA colonization surveillance program. It is not intended to diagnose MRSA infection nor to guide or monitor treatment for MRSA infections. Performed at [facility], [address], [place], [place] [address]   C difficile quick scan w PCR reflex     Status: Abnormal   Collection Time: 09/26/17  2:42 AM  Result Value Ref Range Status   C Diff antigen POSITIVE (A) NEGATIVE Final   C Diff toxin NEGATIVE NEGATIVE Final   C Diff interpretation Results are indeterminate. See PCR results.  Final    Comment: Performed at [facility], [address], [place], [place] [address]  C. Diff by PCR, Reflexed     Status: None   Collection Time: 09/26/17  2:42 AM  Result Value Ref Range Status   Toxigenic C. Difficile by PCR NEGATIVE NEGATIVE Final    Comment: Patient is colonized with non toxigenic C. difficile. May not need treatment unless significant symptoms are present. Performed at [facility], 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 Sagittal and coronal MPR images reconstructed from axial data set. Small amount of oral contrast was administered. COMPARISON:   None FINDINGS: Lower chest: Linear subsegmental atelectasis RIGHT lower lobe. Parenchymal calcifications versus sequela of aspirated contrast material in LEFT lower lobe. Tiny LEFT pleural effusion. Hepatobiliary: Small nodular cirrhotic liver. No definite hepatic mass. Abnormal density dependently in the gallbladder could represent sludge or vicarious excretion of prior contrast. Pancreas: Normal appearance Spleen: Normal size and appearance Adrenals/Urinary Tract: Adrenal glands normal appearance. Kidneys appear small without hydronephrosis. Questionable fat attenuation subcapsular lipoma versus angiomyolipoma at upper pole of LEFT kidney. Bladder and ureters unremarkable. Stomach/Bowel: Stomach grossly normal appearance. Questionable wall thickening of the splenic flexure and proximal descending  colon. Remaining bowel loops unremarkable. Vascular/Lymphatic: Atherosclerotic calcifications aorta without aneurysm. No adenopathy. Reproductive: Atrophic uterus.  Unremarkable adnexa. Other: Marked ascites. Marked subcutaneous edema and skin thickening. Scattered mesenteric edema including significant fluid in the mesentery in the LEFT mid abdomen. Musculoskeletal: No acute osseous findings. Bones appear demineralized. IMPRESSION: Cirrhotic liver with marked ascites and scattered subcutaneous edema question related to anasarca or hypoproteinemia. Question small capsular lipoma versus angiomyolipoma upper pole LEFT kidney. Mild wall thickening of the splenic flexure and disc proximal descending colon cannot exclude colitis. Bibasilar atelectasis and tiny LEFT pleural effusion. Aortic Atherosclerosis (ICD10-I70.0). Electronically Signed   By: Ulyses Southward M.D.   On: 10/03/2017 13:50    Scheduled Meds: . feeding supplement (GLUCERNA SHAKE)  237 mL Oral BID BM  . FLUoxetine  40 mg Oral Daily  . heparin  5,000 Units Subcutaneous Q8H  . insulin aspart  0-5 Units Subcutaneous QHS  . insulin aspart  0-9 Units  Subcutaneous TID WC  . lactulose  20 g Oral Daily  . levothyroxine  125 mcg Oral QAC breakfast  . midodrine  5 mg Oral TID WC  . potassium chloride  40 mEq Oral Once  . potassium chloride  40 mEq Oral Once  . rifaximin  550 mg Oral BID    Continuous Infusions: . albumin human       Time spent: 35 mins, case discussed with nephrology and palliative care over the phone I have personally reviewed and interpreted on  10/03/2017 daily labs, tele strips, imagings as discussed above under date review session and assessment and plans.  I reviewed all nursing notes, pharmacy notes, consultant notes,  vitals, pertinent old records  I have discussed plan of care as described above with RN , patient on 10/03/2017   Albertine Grates MD, PhD  [facility] Pager [phone]. If 7PM-7AM, please contact night-coverage at [email or web address], password [facility] 10/03/2017, 2:11 PM  LOS: 9 days

## 2017-10-03 NOTE — Progress Notes (Signed)
Daily Progress Note   Patient Name: [name]       Date: 10/03/2017 DOB: September 15, 1960  Age: 58 y.o. MRN#: [id number] Attending Physician: Albertine Grates, MD Primary Care Physician: Patient, No Pcp Per Admit Date: 09/24/2017  Reason for Consultation/Follow-up: Establishing goals of care  Subjective: [name] received judicial order yesterday to be released from [employer] custody yesterday. Her sisters are planning to arrive tomorrow morning from [place] for further GOC- will discuss going home with Hospice. [name] continues to have lower abdominal pain mainly when she eats- discussed with Dr. Linna Darner and Dr. Roda Shutters- recommend CT scan for additional prognostication and identification of possible areas of ascites for palliative paracentesis for pain relief.   Review of Systems  Constitutional: Positive for malaise/fatigue.  All other systems reviewed and are negative.   Length of Stay: 9  Current Medications: Scheduled Meds:  . feeding supplement (GLUCERNA SHAKE)  237 mL Oral BID BM  . FLUoxetine  40 mg Oral Daily  . heparin  5,000 Units Subcutaneous Q8H  . insulin aspart  0-5 Units Subcutaneous QHS  . insulin aspart  0-9 Units Subcutaneous TID WC  . lactulose  20 g Oral Daily  . levothyroxine  125 mcg Oral QAC breakfast  . midodrine  10 mg Oral TID WC  . potassium chloride  40 mEq Oral Once  . potassium chloride  40 mEq Oral Once  . rifaximin  550 mg Oral BID    Continuous Infusions: . octreotide  (SANDOSTATIN)    IV infusion 50 mcg/hr (10/03/17 0706)    PRN Meds: acetaminophen **OR** acetaminophen, LORazepam, ondansetron (ZOFRAN) IV, oxyCODONE  Physical Exam  Constitutional: She is oriented to person, place, and time. She appears well-developed. No distress.  HENT:    exopthalmous  Cardiovascular: Normal rate and regular rhythm.  Pulmonary/Chest: Effort normal and breath sounds normal.  Abdominal: She exhibits distension. There is no tenderness.  Neurological: She is alert and oriented to person, place, and time.  Skin: Skin is warm.  Scattered pettichiae  Psychiatric: She has a normal mood and affect.  Nursing note and vitals reviewed.           Vital Signs: BP 130/80 (BP Location: Right Arm)   Pulse 84   Temp (!) 97.5 F (36.4 C) (Oral)   Resp 18   Ht  5\' 8"  (1.727 m)   Wt 122 kg (268 lb 15.4 oz)   SpO2 100%   BMI 40.90 kg/m  SpO2: SpO2: 100 % O2 Device: O2 Device: Room Air O2 Flow Rate:    Intake/output summary:   Intake/Output Summary (Last 24 hours) at 10/03/2017 1202 Last data filed at 10/03/2017 [id number] Gross per 24 hour  Intake 2230.83 ml  Output 300 ml  Net 1930.83 ml   LBM: Last BM Date: 10/01/17 Baseline Weight: Weight: 108.9 kg (240 lb) Most recent weight: Weight: 122 kg (268 lb 15.4 oz)       Palliative Assessment/Data: PPS: 20%     Patient Active Problem List   Diagnosis Date Noted  . Abnormal liver function   . ARF (acute renal failure) (HCC) 09/24/2017  . Diarrhea 09/24/2017  . Anemia 09/24/2017  . Tachycardia 09/24/2017    Palliative Care Assessment & Plan   Patient Profile: 57 y.o. female  with past medical history of HTN, hypothyroid, CKD admitted on 09/24/2017 with diarrhea for several months, nausea, vomiting. Workup reveals acute on chronic kidney failure, hepatic failure, hepatic encephalopathy- likely hepatorenal syndrome. Patient is not candidate for dialysis given comorbidities of end stage hepatic failure. Palliative medicine consulted for further GOC.       Assessment/Recommendations/Plan   Plan to meet with sisters on 3/21 for further GOC- will recommend home with Hospice- have discussed this with patient  Recommend CT scan for further prognostication and help with identifying areas of ascites  for palliative paracentesis (per discussion with Dr. Linna DarnerAnwar)- discussed with Dr. Roda ShuttersXu  Goals of Care and Additional Recommendations:  Limitations on Scope of Treatment: Full Scope Treatment  Code Status:  DNR  Prognosis:   < 6 months d/t hepatic failure, hepatorenal failure, end stage hepatorenal failure aeb hepatorenal failure, hepatic encephalopathy,  Serum Albumin < 2.5 (1.9), PT>5 (16.5),    Discharge Planning:  Home with Hospice  Care plan was discussed with patient, Dr. Linna DarnerAnwar, Dr. Roda ShuttersXu, Care Manager  Thank you for allowing the Palliative Medicine Team to assist in the care of this patient.   Time In: 1000 Time Out: 1035 Total Time 35 mins Prolonged Time Billed no      Greater than 50%  of this time was spent counseling and coordinating care related to the above assessment and plan.  Ocie BobKasie Nakaila Freeze, AGNP-C Palliative Medicine   Please contact Palliative Medicine Team phone at [phone] for questions and concerns.

## 2017-10-03 NOTE — Progress Notes (Signed)
PT Cancellation Note  Patient Details Name: Patricia LuoKathy Pilz MRN: [id number] DOB: April 24, 1961   Cancelled Treatment:     pt just returned to room and stated she was "very sleepy".  Pt plans to D/C to her sister's in [place] with Hospice.   Felecia ShellingLori Kesley Gaffey  PTA [facility]  Acute  Rehab Pager      [phone]

## 2017-10-03 NOTE — Procedures (Signed)
Ultrasound-guided diagnostic and therapeutic paracentesis performed yielding 3 liters (maximum ordered) of clear, yellow fluid. No immediate complications.  A portion of the fluid was submitted to the lab for preordered studies.

## 2017-10-03 NOTE — Progress Notes (Signed)
OT Cancellation Note  Patient Details Name: [name] MRN: [id number] DOB: 29-Oct-1960   Cancelled Treatment:    Reason Eval/Treat Not Completed: Patient at procedure or test/ unavailable  Will check back next day. Lise AuerLori Brodie Correll, [place] [phone]  [name], [name] D 10/03/2017, 1:01 PM

## 2017-10-04 DIAGNOSIS — Z7189 Other specified counseling: Secondary | ICD-10-CM

## 2017-10-04 DIAGNOSIS — N179 Acute kidney failure, unspecified: Principal | ICD-10-CM

## 2017-10-04 DIAGNOSIS — K767 Hepatorenal syndrome: Secondary | ICD-10-CM

## 2017-10-04 DIAGNOSIS — R627 Adult failure to thrive: Secondary | ICD-10-CM

## 2017-10-04 DIAGNOSIS — K7460 Unspecified cirrhosis of liver: Secondary | ICD-10-CM

## 2017-10-04 LAB — GLUCOSE, CAPILLARY: Glucose-Capillary: 122 mg/dL — ABNORMAL HIGH (ref 65–99)

## 2017-10-04 LAB — GRAM STAIN: Gram Stain: NONE SEEN

## 2017-10-04 LAB — BASIC METABOLIC PANEL
ANION GAP: 9 (ref 5–15)
BUN: 57 mg/dL — ABNORMAL HIGH (ref 6–20)
CALCIUM: 8.3 mg/dL — AB (ref 8.9–10.3)
CO2: 17 mmol/L — AB (ref 22–32)
Chloride: 113 mmol/L — ABNORMAL HIGH (ref 101–111)
Creatinine, Ser: 4.11 mg/dL — ABNORMAL HIGH (ref 0.44–1.00)
GFR calc non Af Amer: 11 mL/min — ABNORMAL LOW (ref >60)
GFR, EST AFRICAN AMERICAN: 13 mL/min — AB (ref >60)
Glucose, Bld: 112 mg/dL — ABNORMAL HIGH (ref 65–99)
POTASSIUM: 3.3 mmol/L — AB (ref 3.5–5.1)
Sodium: 139 mmol/L (ref 135–145)

## 2017-10-04 MED ORDER — POTASSIUM CHLORIDE CRYS ER 20 MEQ PO TBCR
40.0000 meq | EXTENDED_RELEASE_TABLET | Freq: Once | ORAL | Status: AC
  Administered 2017-10-04: 40 meq via ORAL
  Filled 2017-10-04: qty 2

## 2017-10-04 MED ORDER — SODIUM BICARBONATE 650 MG PO TABS
650.0000 mg | ORAL_TABLET | Freq: Every day | ORAL | Status: DC
  Administered 2017-10-04: 650 mg via ORAL
  Filled 2017-10-04: qty 1

## 2017-10-04 MED ORDER — LACTULOSE 10 GM/15ML PO SOLN: 20.0000 g | Freq: Every day | ORAL | 0 refills | Status: AC

## 2017-10-04 MED ORDER — LEVOTHYROXINE SODIUM 100 MCG PO TABS: 100.0000 ug | ORAL_TABLET | Freq: Every day | ORAL | 0 refills | Status: AC

## 2017-10-04 MED ORDER — SODIUM BICARBONATE 650 MG PO TABS: 650.0000 mg | ORAL_TABLET | Freq: Every day | ORAL | 0 refills | Status: AC

## 2017-10-04 MED ORDER — RIFAXIMIN 550 MG PO TABS: 550.0000 mg | ORAL_TABLET | Freq: Two times a day (BID) | ORAL | 0 refills | Status: AC

## 2017-10-04 MED ORDER — OXYCODONE HCL 5 MG PO TABS: 5.0000 mg | ORAL_TABLET | ORAL | 0 refills | Status: AC | PRN

## 2017-10-04 MED ORDER — POTASSIUM CHLORIDE CRYS ER 20 MEQ PO TBCR: 40.0000 meq | EXTENDED_RELEASE_TABLET | ORAL | 0 refills | Status: AC

## 2017-10-04 NOTE — Discharge Summary (Addendum)
Discharge Summary  [name] BJY:[id number] DOB: [birth year]  PCP: Patient, No Pcp Per  Admit date: 09/24/2017 Discharge date: 10/04/2017   Pt's address is [address], [place], [place].  Sister Celine AhrJoyce Lylons [phone]  Time spent: >3030mins, more than 50% time spent on coordination of care.    Recommendations for Outpatient Follow-up:  1. F/u with PMD within a week  for hospital discharge follow up, repeat cbc/bmp at follow up Appointment with PCP Tawny AsalPatricia Osborne, NP March 29 th at 9:00 AM. [facility] [place] [address] [place], [place] [id number] [phone]  2. pcp to arrange home hospice   Discharge Diagnoses:  Active Hospital Problems   Diagnosis Date Noted  . Renal failure 09/24/2017  . Cirrhosis of liver without ascites (HCC)   . Advanced care planning/counseling discussion   . Liver failure without hepatic coma (HCC)   . Palliative care by specialist   . Abnormal liver function   . Diarrhea 09/24/2017  . Anemia 09/24/2017  . Tachycardia 09/24/2017    Resolved Hospital Problems  No resolved problems to display.    Discharge Condition: stable  Diet recommendation: heart healthy  Filed Weights   10/02/17 0638 10/03/17 0356 10/04/17 0449  Weight: 118.8 kg (262 lb) 122 kg (268 lb 15.4 oz) 120.5 kg (265 lb 10.5 oz)    History of present illness:  KathyXXXLawsonis a56 y.o.female,w hypertension, ckd (unclear stage, no baseline creatinine), apparently presents w c/ochronic diarrhea for months as well as n/v a few days prior and some bilateral lower abdominal discomfort. Pt denies fever, chills, cp, palp, sob, constipation, brbpr, black stool. Pt also noted slight facial swelling around her eye. [facility] ED [facility].   In ED,  Wbc 7.0, Hgb 11.6, Plt 221 Na 140, K 4.9 Bun 79, Creatinne 5.39 Alb 2.2 Ast 87, Alt 80   Pt will be admitted for ARF, and abnormal liver function and also  anemia.   Hospital Course:  Principal Problem:   Renal failure Active Problems:   Diarrhea   Anemia   Tachycardia   Abnormal liver function   Liver failure without hepatic coma (HCC)   Palliative care by specialist   Cirrhosis of liver without ascites (HCC)   Advanced care planning/counseling discussion   Cirrhosis , hepatic encephalopathy , ascites ,hepatorenal syndrome, AKI -sHe was sent from jail to hospital due to increased swelling -Is found to be in AK I, BUN 82 creatinine 5.27 on presentation, she has elevated ammonia level 143 And confusion -Nephrology consulted on presentation, started on octreotide drip admitted abdomen for treatment of hepatorenal syndrome, however she has not improved after 7-8 days treatment. Octreotide drip and amiodarone stopped today. neurology recommended palliative care consult -She received therapeutic paracentesis with iv albumin infusion on 3/20 --total of  3 Ls of clear fluid removed, fluids study no sbp. She feels much better after the procedure. She might benefit from prn therapeutic paracentesis. -She is continued on Xifaxan/lactulose -discharge home on home hospice. [place]. Case manager has set up her follow up appointment. pcp will need to arrange home hospice.  Palliative care input appreciated  Hypothyroidismcontinue Synthroid for now  Code Status:DNR  Family Communication:patient   Disposition Plan:home with home hospice  on 3/21   Consultants:  Nephrology  Palliative care  Procedures:  [place] guided paracentesis on 3/20  Antibiotics:  none   Discharge Exam: BP 124/72 (BP Location: Left Arm)   Pulse 69  Temp 98.4 F (36.9 C) (Oral)   Resp 20   Ht 5\' 8"  (1.727 m)   Wt 120.5 kg (265 lb 10.5 oz)   LMP  (Exact Date)   SpO2 98%   BMI 40.39 kg/m     General:Frail, weak, chronically ill appearing, NAD, aaox3, generalized edema  Cardiovascular:RRR  Respiratory:diminished  at basis  Abdomen:less distended, nontender, no guarding, positive BS  Musculoskeletal: Edematous  Neuro: alert, oriented     Discharge Instructions You were cared for by a hospitalist during your hospital stay. If you have any questions about your discharge medications or the care you received while you were in the hospital after you are discharged, you can call the unit and asked to speak with the hospitalist on call if the hospitalist that took care of you is not available. Once you are discharged, your primary care physician will handle any further medical issues. Please note that NO REFILLS for any discharge medications will be authorized once you are discharged, as it is imperative that you return to your primary care physician (or establish a relationship with a primary care physician if you do not have one) for your aftercare needs so that they can reassess your need for medications and monitor your lab values.  Discharge Instructions    Diet - low sodium heart healthy   Complete by:  As directed    Increase activity slowly   Complete by:  As directed      Allergies as of 10/04/2017   No Known Allergies     Medication List    TAKE these medications   FLUoxetine 20 MG capsule Commonly known as:  PROZAC Take 40 mg by mouth daily.   lactulose 10 GM/15ML solution Commonly known as:  CHRONULAC Take 30 mLs (20 g total) by mouth daily. Start taking on:  10/05/2017   levothyroxine 100 MCG tablet Commonly known as:  SYNTHROID, LEVOTHROID Take 1 tablet (100 mcg total) by mouth daily before breakfast. Start taking on:  10/05/2017   MULTIVITAMIN ADULT PO Take 1 tablet by mouth daily.   oxyCODONE 5 MG immediate release tablet Commonly known as:  Oxy IR/ROXICODONE Take 1 tablet (5 mg total) by mouth every 4 (four) hours as needed for moderate pain.   potassium chloride SA 20 MEQ tablet Commonly known as:  K-DUR,KLOR-CON Take 2 tablets (40 mEq total) by mouth every Monday,  Wednesday, and Friday for 3 doses. Start taking on:  10/05/2017   rifaximin 550 MG Tabs tablet Commonly known as:  XIFAXAN Take 1 tablet (550 mg total) by mouth 2 (two) times daily.   sodium bicarbonate 650 MG tablet Take 1 tablet (650 mg total) by mouth daily. Start taking on:  10/05/2017      No Known Allergies Follow-up Information    Anell Barr Follow up on 10/12/2017.   Why:  appointment 10/12/17 at 9:00 AM, please keep this appointment.  Contact information: [facility] [address] [place], [place] [address] [phone]           The results of significant diagnostics from this hospitalization (including imaging, microbiology, ancillary and laboratory) are listed below for reference.    Significant Diagnostic Studies: US Abdomen Complete  Result Date: 09/25/2017 CLINICAL DATA:  Abnormal liver function tests, hypertension, diabetes EXAM: ABDOMEN ULTRASOUND COMPLETE COMPARISON:  None. FINDINGS: Gallbladder: There are gallstones present, the largest of 2.4 cm in diameter with acoustical shadowing. There is no pain over the gallbladder with compression. The gallbladder  wall is somewhat prominent measuring 4.5 mm. No pericholecystic fluid is seen. Common bile duct: Diameter: The common bile duct is normal measuring 5.2 mm in diameter. Liver: The liver is small and echogenic consistent with fatty infiltration. The contours are somewhat irregular which may indicate changes of cirrhosis. Correlate clinically. Portal vein is patent on color Doppler imaging with normal direction of blood flow towards the liver. IVC: No abnormality visualized. Pancreas: The pancreas is obscured by bowel gas. Spleen: The spleen measures 8.9 cm. Right Kidney: Length: 9.3 cm.  No hydronephrosis is seen. Left Kidney: Length: 6.4 cm. The left kidney appears atrophic. No hydronephrosis is noted. Abdominal aorta: The abdominal aorta is normal in caliber. Other findings: There is a  moderate amount of ascites throughout the peritoneal cavity. IMPRESSION: 1. Gallstones, the largest of 2.4 cm. No pain is present over the gallbladder currently with compression. 2. Echogenic liver parenchyma with the liver being small with somewhat nodular contour suggesting changes of cirrhosis. Correlate clinically. 3. The pancreas is obscured by bowel gas. 4. Atrophic appearing left kidney.  No hydronephrosis. 5. Moderate amount of ascites throughout the peritoneal cavity. Electronically Signed   By: Patricia Dee M.D.   On: 09/25/2017 10:35   US Paracentesis  Result Date: 10/03/2017 INDICATION: Patient with history of chronic kidney disease, cirrhosis, ascites. Request made for diagnostic and therapeutic paracentesis up to 3 liters. EXAM: ULTRASOUND GUIDED DIAGNOSTIC AND THERAPEUTIC PARACENTESIS MEDICATIONS: None COMPLICATIONS: None immediate. PROCEDURE: Informed written consent was obtained from the patient after a discussion of the risks, benefits and alternatives to treatment. A timeout was performed prior to the initiation of the procedure. Initial ultrasound scanning demonstrates a moderate-to-large amount of ascites within the left lower abdominal quadrant. The left lower abdomen was prepped and draped in the usual sterile fashion. 1% lidocaine was used for local anesthesia. Following this, a 19 gauge, 7-cm, Yueh catheter was introduced. An ultrasound image was saved for documentation purposes. The paracentesis was performed. The catheter was removed and a dressing was applied. The patient tolerated the procedure well without immediate post procedural complication. FINDINGS: A total of approximately 3 liters of clear, yellow fluid was removed. Samples were sent to the laboratory as requested by the clinical team. IMPRESSION: Successful ultrasound-guided diagnostic and therapeutic paracentesis yielding 3 liters of peritoneal fluid. Read by: Jeananne Rama, PA-C Electronically Signed   By: Corlis Leak M.D.    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 Sagittal and coronal MPR images reconstructed from axial data set. Small amount of oral contrast was administered. COMPARISON:  None FINDINGS: Lower chest: Linear subsegmental atelectasis RIGHT lower lobe. Parenchymal calcifications versus sequela of aspirated contrast material in LEFT lower lobe. Tiny LEFT pleural effusion. Hepatobiliary: Small nodular cirrhotic liver. No definite hepatic mass. Abnormal density dependently in the gallbladder could represent sludge or vicarious excretion of prior contrast. Pancreas: Normal appearance Spleen: Normal size and appearance Adrenals/Urinary Tract: Adrenal glands normal appearance. Kidneys appear small without hydronephrosis. Questionable fat attenuation subcapsular lipoma versus angiomyolipoma at upper pole of LEFT kidney. Bladder and ureters unremarkable. Stomach/Bowel: Stomach grossly normal appearance. Questionable wall thickening of the splenic flexure and proximal descending colon. Remaining bowel loops unremarkable. Vascular/Lymphatic: Atherosclerotic calcifications aorta without  aneurysm. No adenopathy. Reproductive: Atrophic uterus.  Unremarkable adnexa. Other: Marked ascites. Marked subcutaneous edema and skin thickening. Scattered mesenteric edema including significant fluid in the mesentery in the LEFT mid abdomen. Musculoskeletal: No acute osseous findings. Bones appear demineralized. IMPRESSION: Cirrhotic liver with marked ascites and scattered subcutaneous edema question related to anasarca or hypoproteinemia. Question small capsular lipoma versus angiomyolipoma  upper pole LEFT kidney. Mild wall thickening of the splenic flexure and disc proximal descending colon cannot exclude colitis. Bibasilar atelectasis and tiny LEFT pleural effusion. Aortic Atherosclerosis (ICD10-I70.0). Electronically Signed   By: Ulyses Southward M.D.   On: 10/03/2017 13:50    Microbiology: Recent Results (from the past 240 hour(s))  MRSA PCR Screening     Status: None   Collection Time: 09/24/17 10:13 PM  Result Value Ref Range Status   MRSA by PCR NEGATIVE NEGATIVE Final    Comment:        The GeneXpert MRSA Assay (FDA approved for NASAL specimens only), is one component of a comprehensive MRSA colonization surveillance program. It is not intended to diagnose MRSA infection nor to guide or monitor treatment for MRSA infections. Performed at [facility], [address] [place], [place] [address]   C difficile quick scan w PCR reflex     Status: Abnormal   Collection Time: 09/26/17  2:42 AM  Result Value Ref Range Status   C Diff antigen POSITIVE (A) NEGATIVE Final   C Diff toxin NEGATIVE NEGATIVE Final   C Diff interpretation Results are indeterminate. See PCR results.  Final    Comment: Performed at [facility], [address], [place], [place] [address]  C. Diff by PCR, Reflexed     Status: None   Collection Time: 09/26/17  2:42 AM  Result Value Ref Range Status   Toxigenic C. Difficile by PCR NEGATIVE NEGATIVE Final    Comment: Patient is colonized with non toxigenic C. difficile. May not need treatment unless significant symptoms are present. Performed at [facility], [address] [place], [place] [address]   Gram stain     Status: None   Collection Time: 10/03/17  4:03 PM  Result Value Ref Range Status   Specimen Description FLUID PERITONEAL  Final   Special Requests NONE  Final   Gram Stain   Final    NO WBC SEEN NO ORGANISMS SEEN Performed at [facility], [address] [place], [place]  [address]    Report Status 10/04/2017 FINAL  Final     Labs: Basic Metabolic Panel: Recent Labs  Lab 09/30/17 0527 10/01/17 0440 10/02/17 0442 10/03/17 0907 10/04/17 0458  NA 141 140 139 139 139  K 3.6 3.7 3.9 3.5 3.3*  CL 114* 115* 113* 113* 113*  CO2 18* 17* 17* 18* 17*  GLUCOSE 115* 120* 109* 137* 112*  BUN 64* 62* 59* 60* 57*  CREATININE 4.43* 4.23* 4.31* 4.14* 4.11*  CALCIUM 8.3* 8.2* 8.4* 8.3* 8.3*   Liver Function Tests: Recent Labs  Lab 10/01/17 0440 10/02/17 0442  AST 51* 52*  ALT 25 27  ALKPHOS 51 51  BILITOT 1.5* 1.6*  PROT 5.2* 5.3*  ALBUMIN 2.4* 2.3*   No results for input(s): LIPASE, AMYLASE in the last 168 hours. No results for input(s): AMMONIA in the last 168 hours. CBC: Recent Labs  Lab 09/28/17 1024 10/01/17 0440 10/02/17 0442  WBC 4.8 7.2 6.6  HGB 9.6* 9.9* 11.1*  HCT 29.9* 31.1* 34.9*  MCV 87.9  89.4 89.9  PLT 131* 157 162   Cardiac Enzymes: No results for input(s): CKTOTAL, CKMB, CKMBINDEX, TROPONINI in the last 168 hours. BNP: BNP (last 3 results) No results for input(s): BNP in the last 8760 hours.  ProBNP (last 3 results) No results for input(s): PROBNP in the last 8760 hours.  CBG: Recent Labs  Lab 10/03/17 0742 10/03/17 1211 10/03/17 1757 10/03/17 2058 10/04/17 1134  GLUCAP 139* 126* 102* 119* 122*       Signed:  Albertine Grates MD, PhD  [facility] 10/04/2017, 8:51 PM

## 2017-10-04 NOTE — Progress Notes (Addendum)
Daily Progress Note   Patient Name: [name]       Date: 10/04/2017 DOB: 1960-09-21  Age: 57 y.o. MRN#: [id number] Attending Physician: Florencia Reasons, MD Primary Care Physician: Patient, No Pcp Per Admit Date: 09/24/2017  Reason for Consultation/Follow-up: Establishing goals of care  Subjective: Met with patient and her three sisters. Answered their questions related to patient, her illness and prognosis. Plan of care is for patient to return to live with sister- [name]- and have home services of Hospice to be arranged through patient's primary care MD.   Patient reports feeling much better this morning after paracentesis relieving 3L of fluid from her abdomen. CT scan showed small cirrhotic, nodular liver.   ROS  Length of Stay: 10  Current Medications: Scheduled Meds:  . feeding supplement (GLUCERNA SHAKE)  237 mL Oral BID BM  . FLUoxetine  40 mg Oral Daily  . heparin  5,000 Units Subcutaneous Q8H  . insulin aspart  0-5 Units Subcutaneous QHS  . insulin aspart  0-9 Units Subcutaneous TID WC  . lactulose  20 g Oral Daily  . levothyroxine  125 mcg Oral QAC breakfast  . potassium chloride  40 mEq Oral Once  . potassium chloride  40 mEq Oral Once  . rifaximin  550 mg Oral BID  . sodium bicarbonate  650 mg Oral Daily    Continuous Infusions:   PRN Meds: acetaminophen **OR** acetaminophen, LORazepam, ondansetron (ZOFRAN) IV, oxyCODONE  Physical Exam  Constitutional: She is oriented to person, place, and time. She appears well-developed and well-nourished. No distress.  HENT:  Exopthalmus, poor dentition d/t meth use  Cardiovascular: Normal rate and regular rhythm.  Pulmonary/Chest: Effort normal and breath sounds normal.  Abdominal:  Less distention today  Musculoskeletal:  She exhibits edema.  Lower extremity edema, diffuse anasarca  Neurological: She is alert and oriented to person, place, and time.  Skin: Skin is warm and dry.  Psychiatric: She has a normal mood and affect. Her behavior is normal.  Nursing note and vitals reviewed.           Vital Signs: BP 112/64 (BP Location: Left Arm)   Pulse 86   Temp 98 F (36.7 C) (Oral)   Resp 20   Ht 5' 8"  (1.727 m)   Wt 120.5 kg (265 lb 10.5 oz)  LMP  (Exact Date)   SpO2 97%   BMI 40.39 kg/m  SpO2: SpO2: 97 % O2 Device: O2 Device: Room Air O2 Flow Rate:    Intake/output summary:   Intake/Output Summary (Last 24 hours) at 10/04/2017 1254 Last data filed at 10/03/2017 2230 Gross per 24 hour  Intake 240 ml  Output -  Net 240 ml   LBM: Last BM Date: 10/03/17 Baseline Weight: Weight: 108.9 kg (240 lb) Most recent weight: Weight: 120.5 kg (265 lb 10.5 oz)       Palliative Assessment/Data: PPS: 20%      Patient Active Problem List   Diagnosis Date Noted  . Liver failure without hepatic coma ([place])   . Palliative care by specialist   . Abnormal liver function   . Renal failure 09/24/2017  . Diarrhea 09/24/2017  . Anemia 09/24/2017  . Tachycardia 09/24/2017    Palliative Care Assessment & Plan   Patient Profile: 57 y.o.femalewith past medical history of HTN, hypothyroid, CKDadmitted on 3/11/2019with diarrhea for several months, nausea, vomiting.Workup reveals acute on chronic kidney failure, hepatic failure, hepatic encephalopathy- likely hepatorenal syndrome. Patient is not candidate for dialysis given comorbidities of end stage hepatic failure. Palliative medicine consulted for further [place].  Assessment/Recommendations/Plan   D/C home with Hospice  Continue palliative paracentesis per PCP and hospice at home  Code Status:  DNR  Prognosis:   < 6 months hepatic failure, hepatorenal failure, end stage hepatorenal failure aeb hepatorenal failure, hepatic encephalopathy,  Serum Albumin <2.5 (1.9), PT>5 (16.5),   Discharge Planning:  Home with Hospice  Care plan was discussed with patient, her sister, Dr. Erlinda Hong, and RN Care Manager.   Thank you for allowing the Palliative Medicine Team to assist in the care of this patient.   Time In: 1145 Time Out: 1245 Total Time 60 mins Prolonged Time Billed Yes      Greater than 50%  of this time was spent counseling and coordinating care related to the above assessment and plan.  Mariana Kaufman, AGNP-C Palliative Medicine   Please contact Palliative Medicine Team phone at [phone] for questions and concerns.

## 2017-10-04 NOTE — Progress Notes (Signed)
Appointment with PCP Tawny AsalPatricia Osborne, NP March 29 th at 9:00 AM. [facility] [place] [address] [place], [place] [id number] [phone]

## 2017-10-04 NOTE — Progress Notes (Signed)
Information fax[facility] to [facility] [phone], fax [phone]/Pt and 3 sisters at bedside. A call was made to [facility].

## 2017-10-04 NOTE — Progress Notes (Signed)
[facility] called for correct address. Address that was in computer incorrect. Pt's address is [address], [place], [place]. A called to Sister Celine AhrJoyce Lylons [phone] was made to get correct address.

## 2017-10-09 LAB — CULTURE, BODY FLUID-BOTTLE: CULTURE: NO GROWTH

## 2017-11-14 DEATH — deceased

## 2018-09-05 IMAGING — CT CT RENAL STONE PROTOCOL
2 of 4 series · 16 of 46 positions shown, 18 images · non-contrast
Comparison: None

CLINICAL DATA: Chronic renal failure, [place] failure, hepatic
encephalopathy, suspected hepatic renal syndrome, hypertension, type
II diabetes mellitus

EXAM:
CT ABDOMEN AND PELVIS WITHOUT CONTRAST
TECHNIQUE: Multidetector CT imaging of the abdomen and pelvis was performed
following the standard protocol without IV contrast. Sagittal and
coronal MPR images reconstructed from axial data set. Small amount
of oral contrast was administered.

[Series 2: axial st · axial · 0.98mm/px · z∈[-502,-2]mm · 13 of 112 slices shown, 15 images]
[im 6/112  soft-tissue]
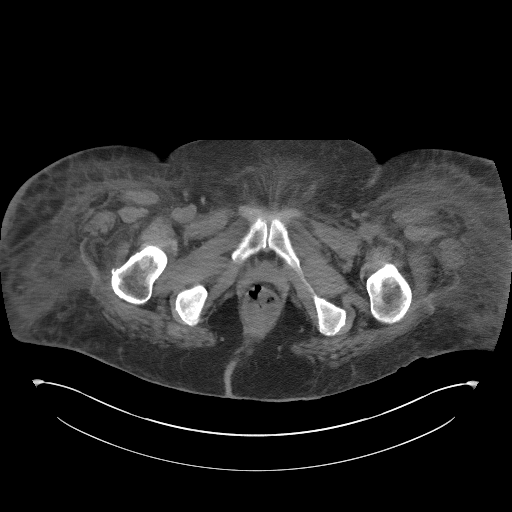
[im 6/112  bone]
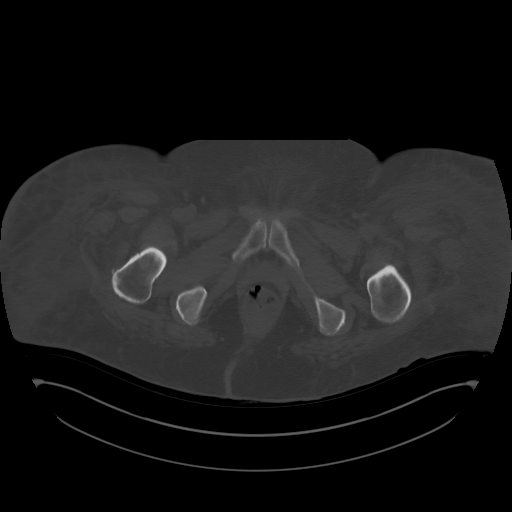
[im 18/112  soft-tissue]
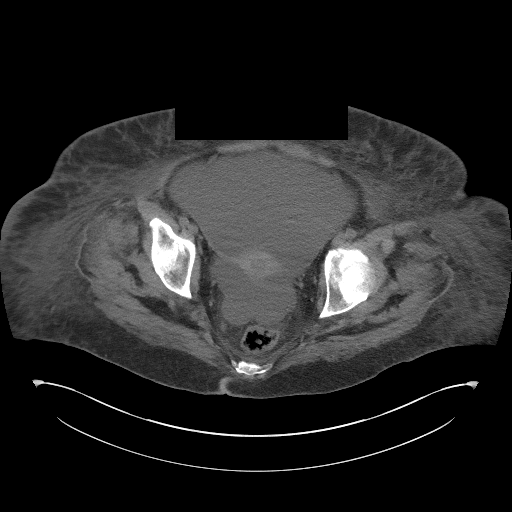
[im 24/112  soft-tissue]
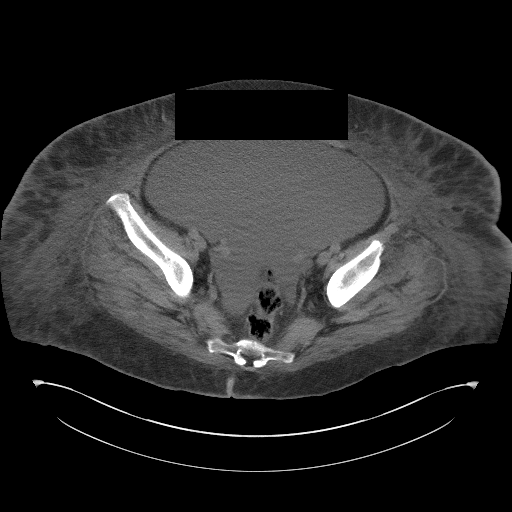
[im 30/112  soft-tissue]
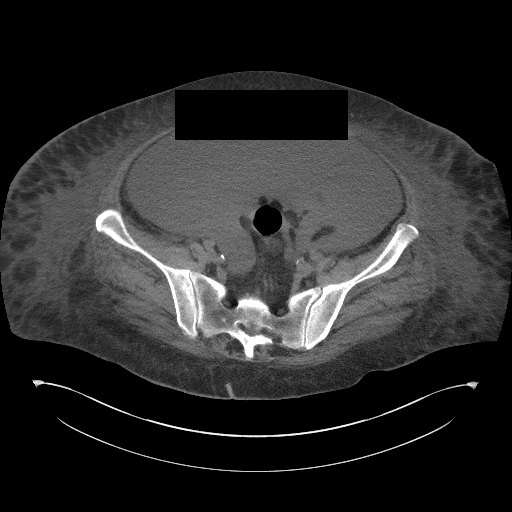
[im 41/112  soft-tissue]
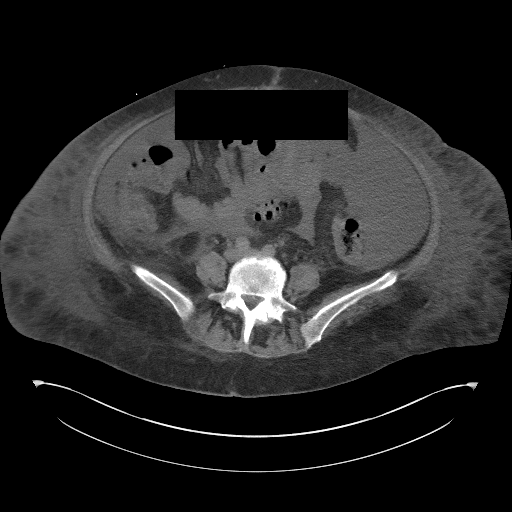
[im 47/112  soft-tissue]
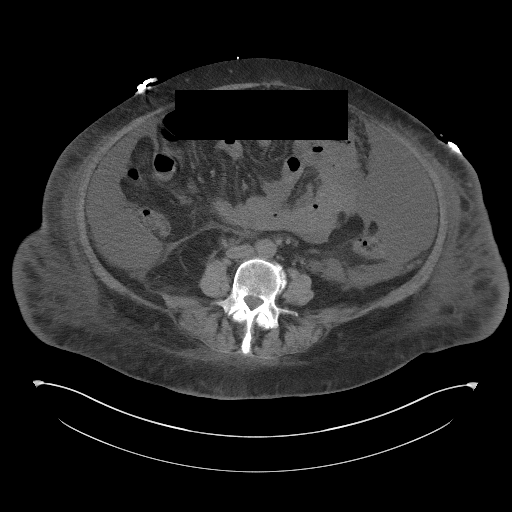
[im 59/112  soft-tissue]
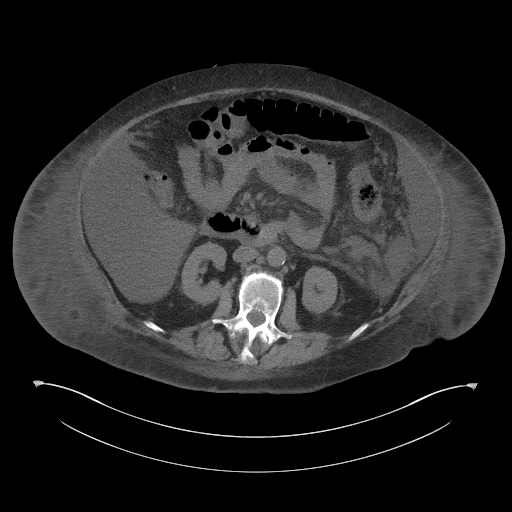
[im 65/112  soft-tissue]
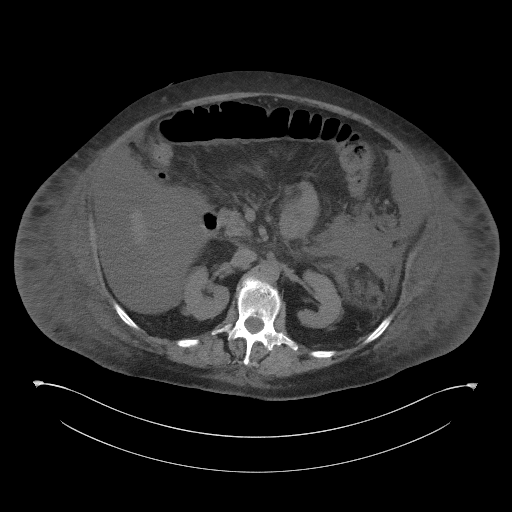
[im 71/112  soft-tissue]
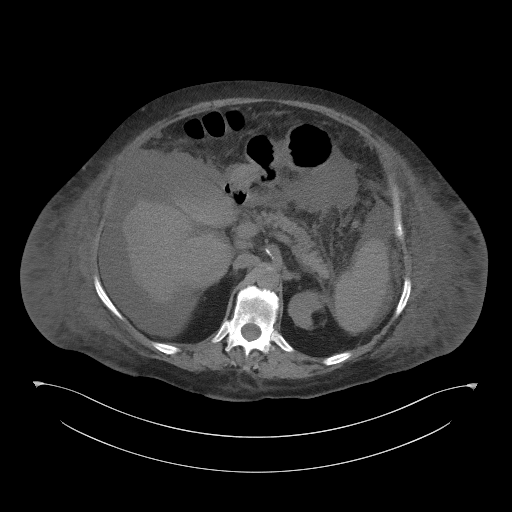
[im 71/112  bone]
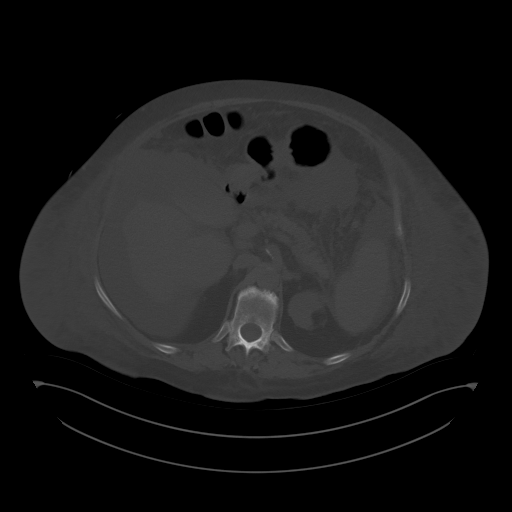
[im 82/112  soft-tissue]
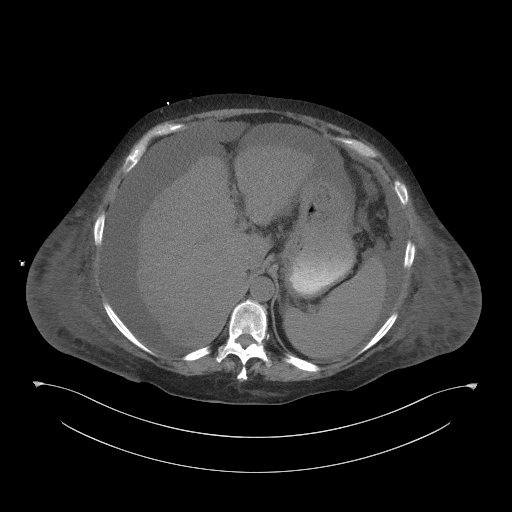
[im 88/112  soft-tissue]
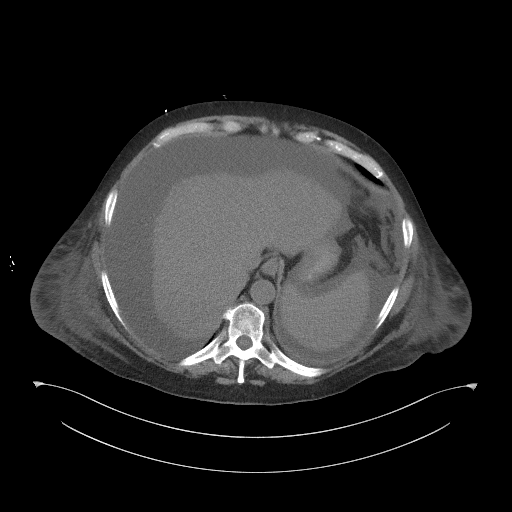
[im 94/112  soft-tissue]
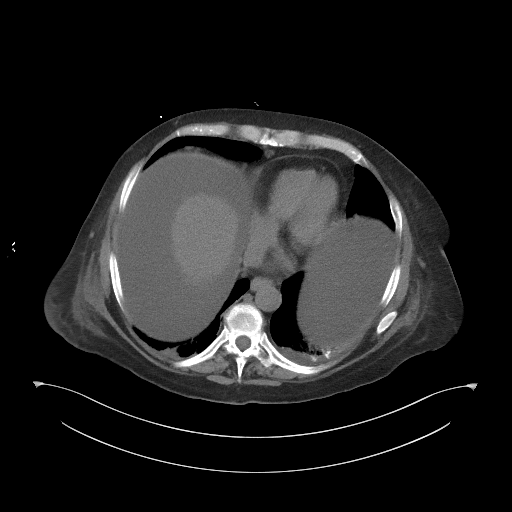
[im 106/112  soft-tissue]
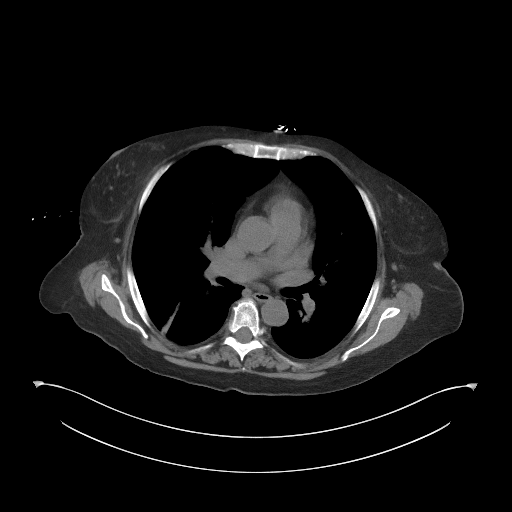

[Series 4: coronal · coronal · 1.10mm/px · 3 of 168 slices shown]
[im 56/168  soft-tissue]
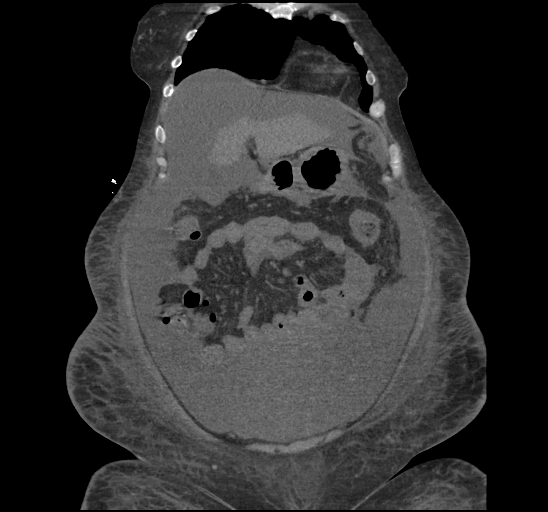
[im 75/168  soft-tissue]
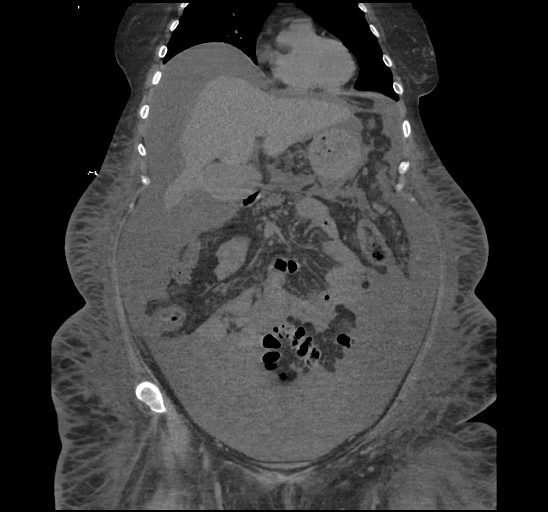
[im 93/168  soft-tissue]
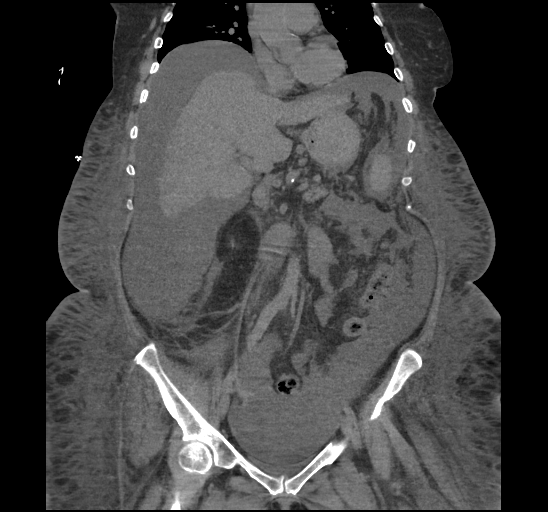

[16 of 46 positions shown; findings below may reference images not displayed]

FINDINGS: Lower chest: Linear subsegmental atelectasis RIGHT lower lobe.
Parenchymal calcifications versus sequela of aspirated contrast
material in LEFT lower lobe. Tiny LEFT pleural effusion.

Hepatobiliary: Small nodular cirrhotic liver. No definite hepatic
mass. Abnormal density dependently in the gallbladder could
represent sludge or vicarious excretion of prior contrast.

Pancreas: Normal appearance

Spleen: Normal size and appearance

Adrenals/Urinary Tract: Adrenal glands normal appearance. Kidneys
appear small without hydronephrosis. Questionable fat attenuation
subcapsular lipoma versus angiomyolipoma at upper pole of LEFT
kidney. Bladder and ureters unremarkable.

Stomach/Bowel: Stomach grossly normal appearance. Questionable wall
thickening of the splenic flexure and proximal descending colon.
Remaining bowel loops unremarkable.

Vascular/Lymphatic: Atherosclerotic calcifications aorta without
aneurysm. No adenopathy.

Reproductive: Atrophic uterus.  Unremarkable adnexa.

Other: Marked ascites. Marked subcutaneous edema and skin
thickening. Scattered mesenteric edema including significant fluid
in the mesentery in the LEFT mid abdomen.

Musculoskeletal: No acute osseous findings. Bones appear
demineralized.
IMPRESSION: Cirrhotic liver with marked ascites and scattered subcutaneous edema
question related to anasarca or hypoproteinemia.

Question small capsular lipoma versus angiomyolipoma upper pole LEFT
kidney.

Mild wall thickening of the splenic flexure and disc proximal
descending colon cannot exclude colitis.

Bibasilar atelectasis and tiny LEFT pleural effusion.

Aortic Atherosclerosis ([id number]).

## 2018-09-16 IMAGING — US US ABDOMEN COMPLETE
1 series · 13 of 25 positions shown · non-contrast
Comparison: None.

CLINICAL DATA: Abnormal liver function tests, hypertension,
diabetes

EXAM:
ABDOMEN ULTRASOUND COMPLETE

[Series 1: us abdomen complete · 0.30mm/px · 13 of 56 slices shown]
[im 1/56]
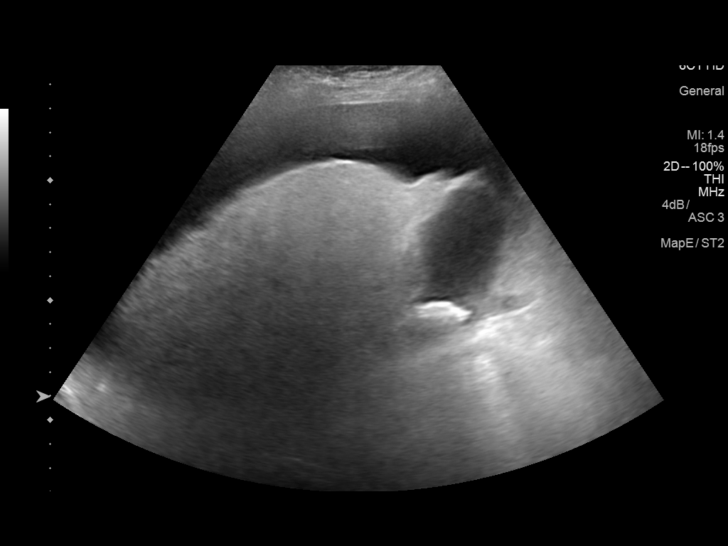
[im 5/56]
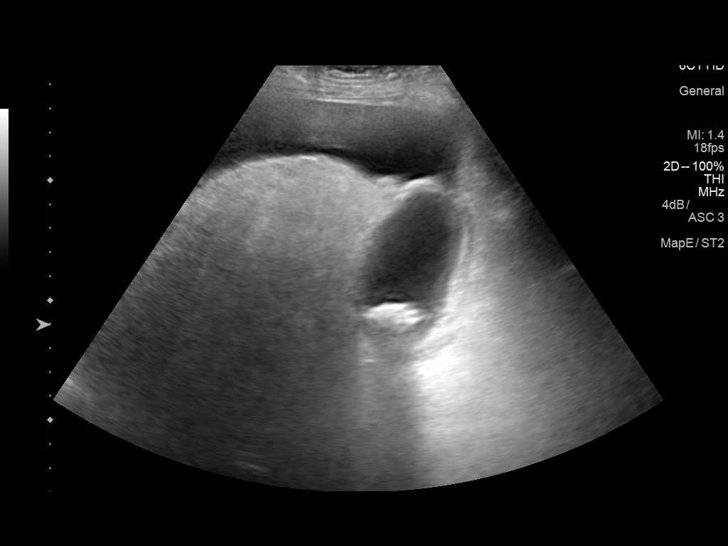
[im 10/56]
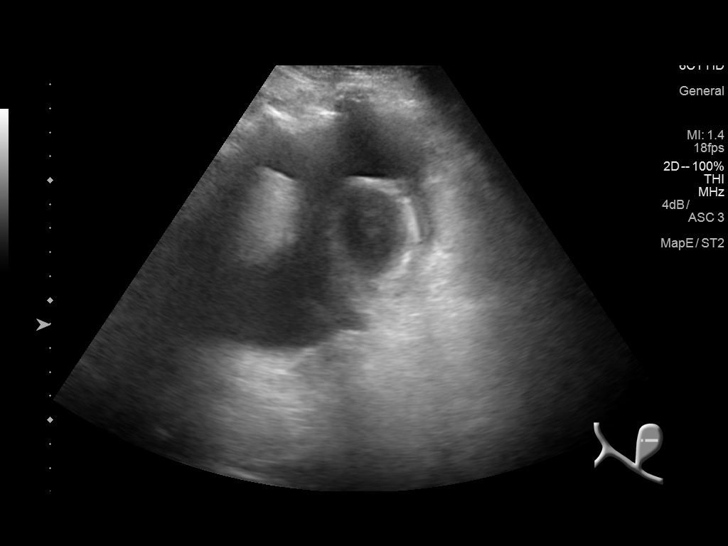
[im 14/56]
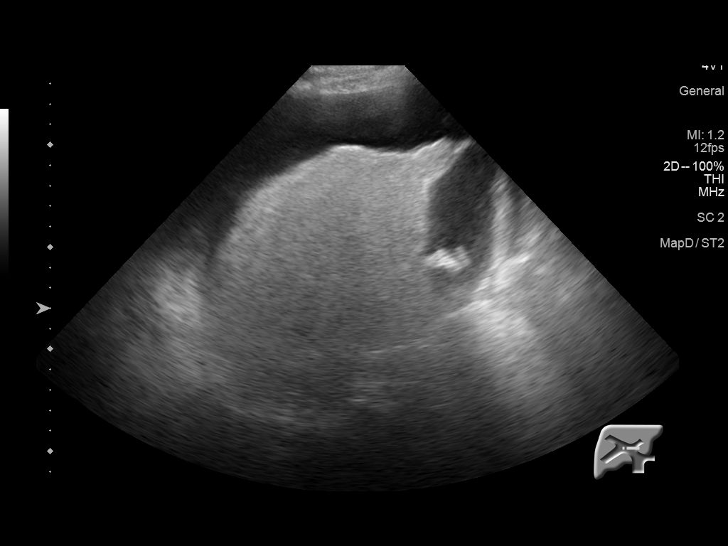
[im 19/56]
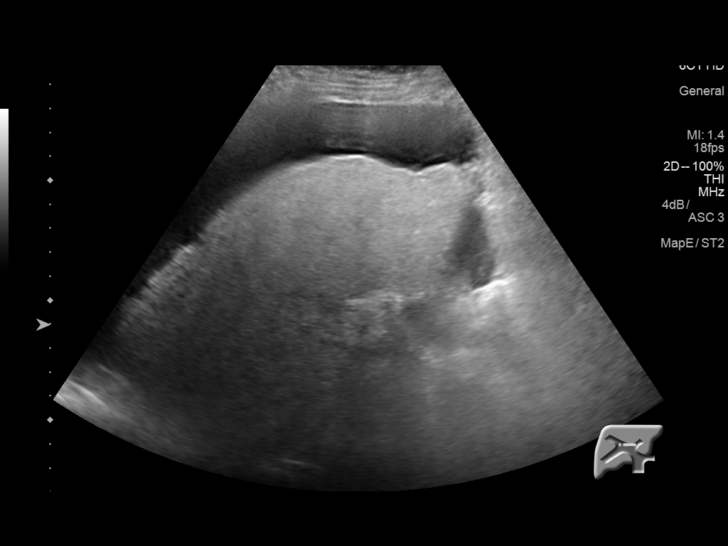
[im 23/56]
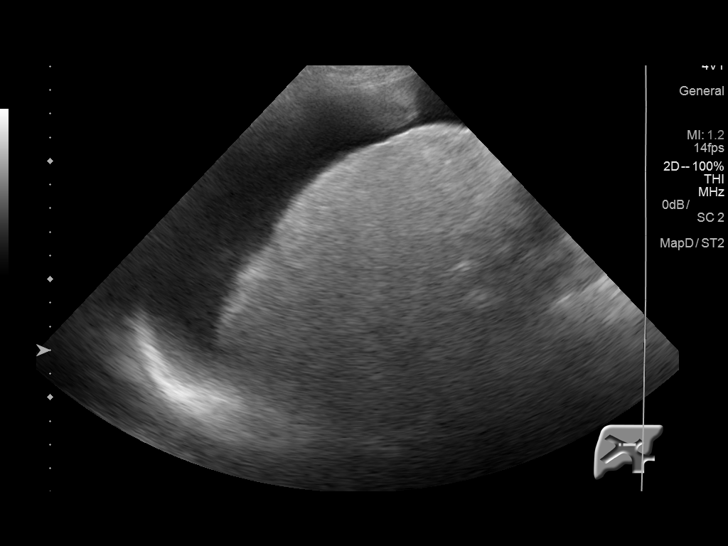
[im 28/56]
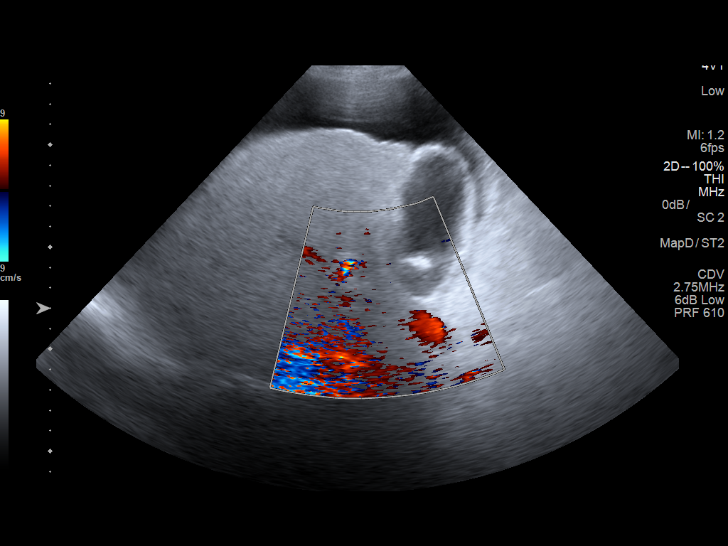
[im 33/56]
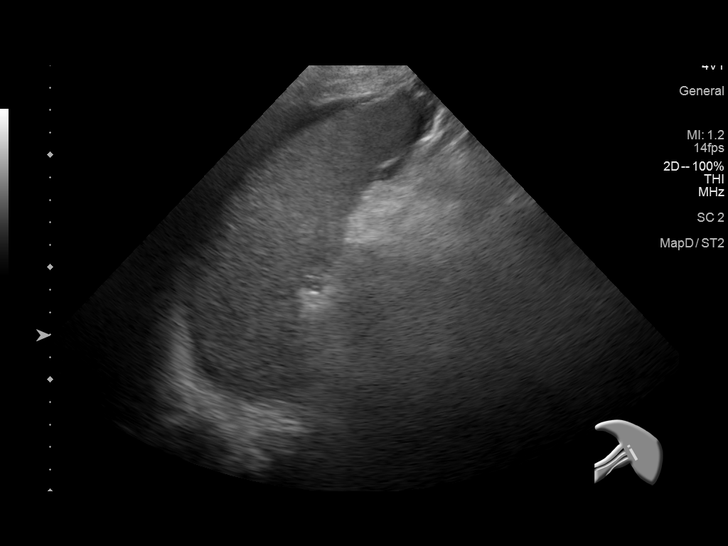
[im 37/56]
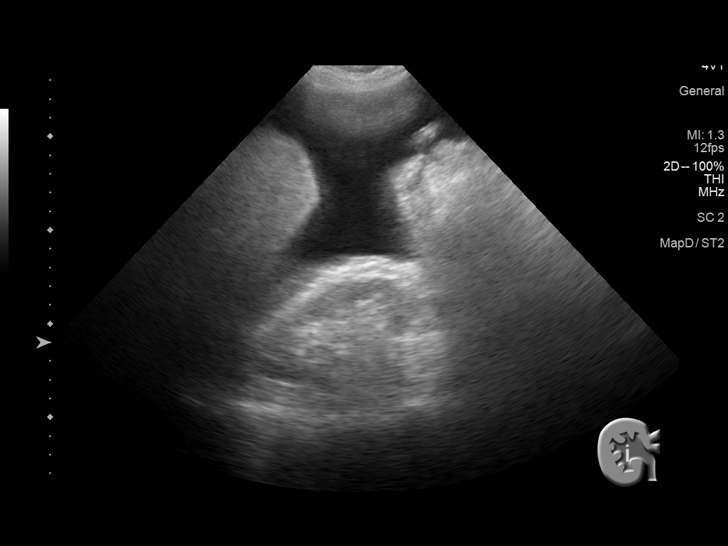
[im 42/56]
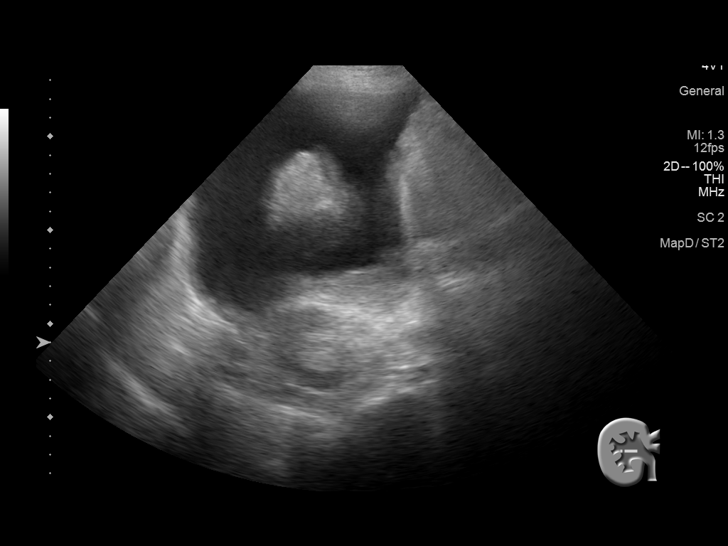
[im 46/56]
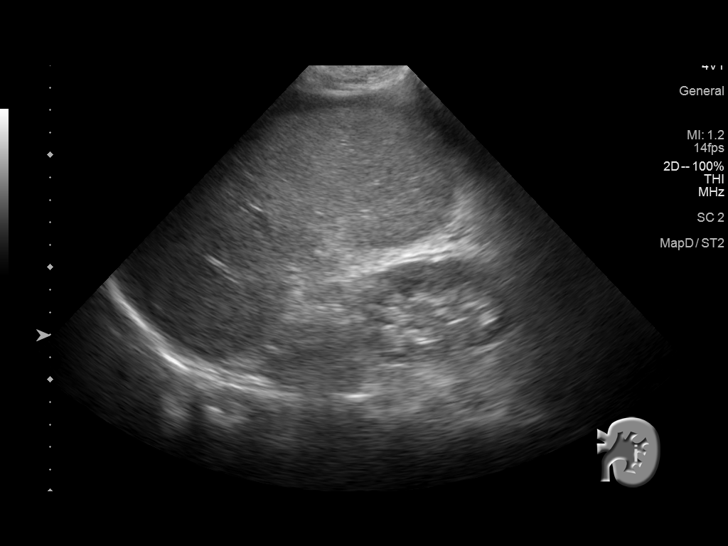
[im 51/56]
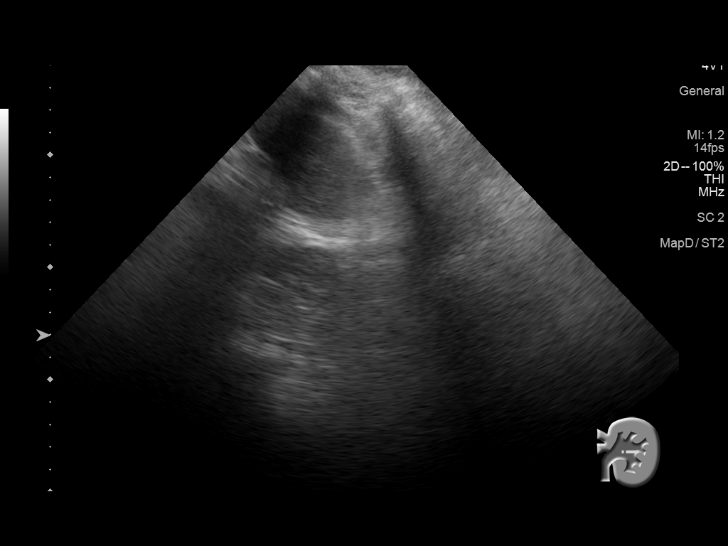
[im 56/56]
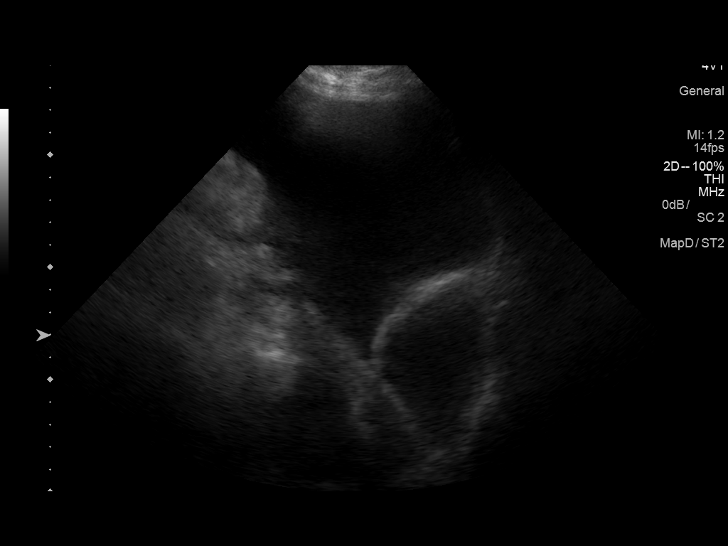

[13 of 25 positions shown; findings below may reference images not displayed]

FINDINGS: Gallbladder: There are gallstones present, the largest of 2.4 cm in
diameter with acoustical shadowing. There is no pain over the
gallbladder with compression. The gallbladder wall is somewhat
prominent measuring 4.5 mm. No pericholecystic fluid is seen.

Common bile duct: Diameter: The common bile duct is normal measuring
5.2 mm in diameter.

Liver: The liver is small and echogenic consistent with fatty
infiltration. The contours are somewhat irregular which may indicate
changes of cirrhosis. Correlate clinically. Portal vein is patent on
color Doppler imaging with normal direction of blood flow towards
the liver.

IVC: No abnormality visualized.

Pancreas: The pancreas is obscured by bowel gas.

Spleen: The spleen measures 8.9 cm.

Right Kidney: Length: 9.3 cm..  No hydronephrosis is seen.

Left Kidney: Length: 6.4 cm.. The left kidney appears atrophic. No
hydronephrosis is noted.

Abdominal aorta: The abdominal aorta is normal in caliber.

Other findings: There is a moderate amount of ascites throughout the
peritoneal cavity.
IMPRESSION: 1. Gallstones, the largest of 2.4 cm. No pain is present over the
gallbladder currently with compression.
2. Echogenic liver parenchyma with the liver being small with
somewhat nodular contour suggesting changes of cirrhosis. Correlate
clinically.
3. The pancreas is obscured by bowel gas.
4. Atrophic appearing left kidney.  No hydronephrosis.
5. Moderate amount of ascites throughout the peritoneal cavity.
